# Patient Record
Sex: Female | Born: 1955
Health system: Southern US, Community
[De-identification: ages and names within clinical notes are randomized; demographics above are authoritative.]

## PROBLEM LIST (undated history)

## (undated) DIAGNOSIS — F329 Major depressive disorder, single episode, unspecified: Secondary | ICD-10-CM

## (undated) DIAGNOSIS — F32A Depression, unspecified: Secondary | ICD-10-CM

## (undated) DIAGNOSIS — I10 Essential (primary) hypertension: Secondary | ICD-10-CM

## (undated) DIAGNOSIS — F419 Anxiety disorder, unspecified: Secondary | ICD-10-CM

## (undated) HISTORY — DX: Depression, unspecified: F32.A

## (undated) HISTORY — DX: Essential (primary) hypertension: I10

## (undated) HISTORY — DX: Anxiety disorder, unspecified: F41.9

## (undated) HISTORY — PX: ABDOMINAL HYSTERECTOMY: SHX81

---
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
## 1998-11-04 ENCOUNTER — Ambulatory Visit (HOSPITAL_COMMUNITY): Admission: RE | Admit: 1998-11-04 | Discharge: 1998-11-04 | Payer: Self-pay | Admitting: Family Medicine

## 2002-02-24 ENCOUNTER — Ambulatory Visit (HOSPITAL_COMMUNITY): Admission: RE | Admit: 2002-02-24 | Discharge: 2002-02-24 | Payer: Self-pay | Admitting: Internal Medicine

## 2005-04-10 ENCOUNTER — Encounter (INDEPENDENT_AMBULATORY_CARE_PROVIDER_SITE_OTHER): Payer: Self-pay | Admitting: *Deleted

## 2005-04-10 ENCOUNTER — Ambulatory Visit (HOSPITAL_BASED_OUTPATIENT_CLINIC_OR_DEPARTMENT_OTHER): Admission: RE | Admit: 2005-04-10 | Discharge: 2005-04-10 | Payer: Self-pay | Admitting: Urology

## 2005-04-24 ENCOUNTER — Ambulatory Visit (HOSPITAL_BASED_OUTPATIENT_CLINIC_OR_DEPARTMENT_OTHER): Admission: RE | Admit: 2005-04-24 | Discharge: 2005-04-24 | Payer: Self-pay | Admitting: Urology

## 2005-04-24 ENCOUNTER — Encounter (INDEPENDENT_AMBULATORY_CARE_PROVIDER_SITE_OTHER): Payer: Self-pay | Admitting: *Deleted

## 2006-01-15 ENCOUNTER — Encounter: Admission: RE | Admit: 2006-01-15 | Discharge: 2006-01-15 | Payer: Self-pay | Admitting: Unknown Physician Specialty

## 2006-03-04 ENCOUNTER — Ambulatory Visit: Payer: Self-pay | Admitting: Family Medicine

## 2006-03-11 ENCOUNTER — Other Ambulatory Visit: Admission: RE | Admit: 2006-03-11 | Discharge: 2006-03-11 | Payer: Self-pay | Admitting: Family Medicine

## 2006-03-13 ENCOUNTER — Encounter: Admission: RE | Admit: 2006-03-13 | Discharge: 2006-03-13 | Payer: Self-pay | Admitting: Family Medicine

## 2006-04-24 ENCOUNTER — Ambulatory Visit (HOSPITAL_BASED_OUTPATIENT_CLINIC_OR_DEPARTMENT_OTHER): Admission: RE | Admit: 2006-04-24 | Discharge: 2006-04-24 | Payer: Self-pay | Admitting: Professional

## 2006-06-27 ENCOUNTER — Encounter: Admission: RE | Admit: 2006-06-27 | Discharge: 2006-06-27 | Payer: Self-pay | Admitting: Family Medicine

## 2006-07-04 ENCOUNTER — Encounter: Admission: RE | Admit: 2006-07-04 | Discharge: 2006-07-04 | Payer: Self-pay | Admitting: Family Medicine

## 2006-07-04 ENCOUNTER — Encounter (INDEPENDENT_AMBULATORY_CARE_PROVIDER_SITE_OTHER): Payer: Self-pay | Admitting: Diagnostic Radiology

## 2007-01-20 ENCOUNTER — Encounter: Admission: RE | Admit: 2007-01-20 | Discharge: 2007-01-20 | Payer: Self-pay | Admitting: Family Medicine

## 2007-07-22 ENCOUNTER — Ambulatory Visit (HOSPITAL_COMMUNITY): Admission: RE | Admit: 2007-07-22 | Discharge: 2007-07-22 | Payer: Self-pay | Admitting: Family Medicine

## 2008-05-19 ENCOUNTER — Ambulatory Visit (HOSPITAL_BASED_OUTPATIENT_CLINIC_OR_DEPARTMENT_OTHER): Admission: RE | Admit: 2008-05-19 | Discharge: 2008-05-19 | Payer: Self-pay | Admitting: Orthopedic Surgery

## 2008-08-09 ENCOUNTER — Encounter: Admission: RE | Admit: 2008-08-09 | Discharge: 2008-08-09 | Payer: Self-pay | Admitting: Family Medicine

## 2010-05-16 NOTE — Op Note (Signed)
NAME:  Melanie Riley, Melanie Riley NO.:  [id number]   MEDICAL RECORD NO.:  [id number]          PATIENT TYPE:  AMB   LOCATION:  DSC                          FACILITY:  [facility]   PHYSICIAN:  Harvie Junior, M.D.   DATE OF BIRTH:  02/06/1955   DATE OF PROCEDURE:  05/19/2008  DATE OF DISCHARGE:                               OPERATIVE REPORT   PREOPERATIVE DIAGNOSES:  Carpal tunnel syndrome, left with de Quervain  first extensor compartment tendonitis.   POSTOPERATIVE DIAGNOSES:  Carpal tunnel syndrome, left with de Quervain  first extensor compartment tendonitis.   PRINCIPAL PROCEDURE:  Left carpal tunnel release and left de Quervain  release.   SURGEON:  Harvie Junior, MD   ASSISTANT:  Marshia Ly, PA   ANESTHESIA:  Forearm-based IV regional.   BRIEF HISTORY:  Melanie Riley is a 55 year old female with a long history  of having significant carpal tunnel syndrome on the left side.  She also  had EMGs which documented this.  She was having significant pain in the  first extensor compartment and improved with injection therapy.  We  talked about treatment options, and ultimately felt the most appropriate  course of action for her was going to be release of first extensor  compartment and carpal tunnel release.  She was brought to the operating  room for this procedure.   PROCEDURE:  The patient was brought to the operating room.  After  adequate anesthesia obtained with general anesthetic, the patient was  placed supine on the operating table.  The left arm was prepped and  draped in the usual sterile fashion after forearm-based IV regional  anesthetic was performed.  Once this was accomplished, a small incision  was made over the distal ulnar to midline wrist crease, subcutaneous  tissues to the level of the carpal tunnel.  The volar carpal ligament  was divided in line with its fibers, and once this was completed, a  gloved finger could be placed in the wound proximally  and distally.  The  median nerve was identified as well as the motor branch and the wound  was then irrigated and closed in interval with interrupted nylon sutures  and attention was turned to just over the radial styloid where a curved  incision was made, subcutaneous tissues down to the level of the first  extensor compartment.  Care being taken to identify and protect the  digital nerves in that area.  Once the skin was opened, the first  extensor compartment was identified and we made this incision.  There  was a very tight stenosis of the first extensor compartment, happily  there were no accessory compartments.  We looked for those and did not  find and  once this was completed, the wound was copiously and thoroughly  irrigated and closed with interrupted nylon stitches.  A sterile  compressive dressing was applied as well as a thumb spica splint and the  patient taken to recovery and was noted to be in satisfactory condition.  Estimated blood loss for the procedure was none.      John L.  Luiz Blare, M.D.  Electronically Signed     JLG/MEDQ  D:  05/19/2008  T:  05/20/2008  Job:  [id number]

## 2010-05-19 NOTE — Op Note (Signed)
Melanie Riley, Melanie Riley NO.:  [id number]   MEDICAL RECORD NO.:  [id number]          PATIENT TYPE:  AMB   LOCATION:  NESC                         FACILITY:  [facility]   PHYSICIAN:  Martina Sinner, MD DATE OF BIRTH:  1955/07/21   DATE OF PROCEDURE:  04/10/2005  DATE OF DISCHARGE:                                 OPERATIVE REPORT   PREOP DIAGNOSIS:  Urethral stenosis; urethral lesion; pelvic pain.   POSTOP DIAGNOSIS:  Urethral stenosis; urethral lesion; pelvic pain.   SURGERY:  Cystoscopy, dilation of urethral stenosis, bladder  hydrodistention, bladder installation therapy and urethral biopsy.   Ms. Sendejo history and physical findings have been previously documented.  She was prepped and draped in usual fashion.  She was given preoperative  intravenous antibiotics.   I used a weighted vaginal speculum and could easily visualize the urethral  meatal circular lesion.  She did tell me prior to going to sleep that the  estrogen cream is helping some and that is encouraging.  I must say the  lesion was quite firm circumferentially.   I first tried to calibrate the urethra with an 18-French catheter.  A 12-  [place] and 18-French catheter would not go through the lesion.  I then  easily inserted a 6-French open-ended ureteral catheter.  I then placed a  sensor wire through this and over this passed the balloon dilation catheter.  I balloon dilated the urethra to 24-French for approximately four minutes.  There was minimal bleeding associated with this upon its removal.   Before cystoscoping the patient, 18-French catheter easily passed into the  urethra.  There was a little bit catching near the meatus where the lesion  was.  I then cystoscoped the patient with a 17-French Olympus scope.   Bladder mucosa trigone were normal.  There was grade 1/4 bladder  trabeculation.  There is no erythema.  I hydrodistended to 550 mL.  I  emptied the bladder and reinspected  bladder.  There is no findings in  keeping with interstitial cystitis.  I carefully cystoscoped urethra.  Urethra is only approximate 2.5 cm in length.  Palpably and visually it is a  bit short,but in my opinion the stenosis was at the meatus where the lesion  was almost as if the lesion was compressing or strangulating the meatus and  the rest of the urethra appeared visually normal and supple.   I decided to gently dilate with the female metal sounds to 26-French.  I  then recystoscoped.  There were the same urethral findings.   At 6 o'clock I then took an 8 mm x 6 mm thick biopsy of the lesion.  There  was some bleeding easily controlled with fulguration.  I used three  interrupted 3-0 chromics on an SH needle to reappose the epithelium.   A Foley catheter had been placed at the time of the biopsy to make certain  that I did not narrow the meatus had good control of the biopsy site.   I decided to leave a Foley catheter at the end of the case.  I instilled 15  mL of 0.5% Marcaine in combination with 400 mg of Pyridium and left a  catheter plug on the catheter.  I will remove the catheter tomorrow and send  her home with straight drainage with a leg bag.  She will be covered  perioperatively with antibiotics.   I want to rule out a malignancy.  This will be very surprising diagnosis if  it did represent a malignancy.  If it is benign, I am going to made her  initially with Estrace cream.  Over the next several weeks,  I will then do  a circumferential excisional biopsy of the lesion and hopefully it will be  much smaller at that time and more supple.  In my opinion, based upon its  position, this can be removed without threatening her continence status.  I  say this recognizing that her urethra is a little bit short, but in my  opinion this was a lesion of the meatus and not of the proximal and mid  urethra.           ______________________________  Martina Sinner, MD   Electronically Signed     SAM/MEDQ  D:  04/10/2005  T:  04/10/2005  Job:  [id number]

## 2010-05-19 NOTE — Op Note (Signed)
Melanie Riley, Melanie Riley NO.:  [id number]   MEDICAL RECORD NO.:  [id number]          PATIENT TYPE:  AMB   LOCATION:  NESC                         [facility]ACILITY:  [facility]   PHYSICIAN:  Martina Sinner, MD DATE OF BIRTH:  [birth year]   DATE OF PROCEDURE:  04/24/2005  DATE OF DISCHARGE:                                 OPERATIVE REPORT   PREOPERATIVE DIAGNOSIS:  Urethral caruncle.   POSTOPERATIVE DIAGNOSIS:  Urethral caruncle.   SURGERY:  1.  Excision of urethral caruncle.  2.  Cystoscopy.   [name] has a caruncle that was quite fibrotic and stenosing in its  nature.  Her symptoms started to recur since her biopsy and dilation.  We  elected to excise it today.   The patient was prepped and draped in the usual fashion.  A 14-French  catheter could be easily placed into the urethra.  She was covered  perioperatively with antibiotics.   With or without the catheter I could not easily see the urethral lumen  mucosa to place stay sutures.  I therefore made a full-thickness incision of  the caruncle 12 and 6 o'clock.  This allowed me to retract the caruncle and  put four 3-0 silk pop-off stay sutures, one in each quadrant, allowing me to  hold the urethral mucosa so it did not retract.  I then circumferentially  made an incision with a #15 scalpel blade around the caruncle.  It was then  very easy to excise using the stay sutures as proximal urethral mucosal  markers.  The caruncle was removed and two segments and sent to pathology.  Surprisingly, bleeding was minimal.  Irrigation was utilized.  I placed  approximately 10 3-0 chromic sutures circumferentially around urethra,  reapproximating periurethral skin to the urethral mucosa.  All four stay  sutures were removed.  The Foley catheter was removed.  I tried to scope the  patient with a 21-French scope, but it was a little bit tight.  I then used  a 17-French scope, which went in quite easily.  The urethra looked  actually  quite good.  It was long.  The excision should not affect her continent  mechanism.  I am going to leave the catheter in for three days.  Triple  antibiotic ointment was added and will be utilized as a dressing.  Will keep  have to keep an eye on her to make sure she does not develop urethral  stenosis.  Certainly, visually and palpably the meatus was soft and supple  but, again, she is at risk of stenosis over time.           ______________________________  Martina Sinner, MD  Electronically Signed     SAM/MEDQ  D:  04/24/2005  T:  04/25/2005  Job:  [id number]

## 2010-05-19 NOTE — Op Note (Signed)
Melanie Riley, Melanie Riley NO.:  [id number]   MEDICAL RECORD NO.:  [id number]          PATIENT TYPE:  AMB   LOCATION:  DSC                          FACILITY:  [facility]   PHYSICIAN:  Harvie Junior, M.D.   DATE OF BIRTH:  07-04-1955   DATE OF PROCEDURE:  04/24/2006  DATE OF DISCHARGE:                               OPERATIVE REPORT   PREOPERATIVE DIAGNOSIS:  Distal radius fracture with interarticular  comminution greater than 3 fragments.   POSTOPERATIVE DIAGNOSIS:  Distal radius fracture with interarticular  comminution greater than 3 fragments.   PRINCIPAL PROCEDURE:  Open reduction and internal fixation of distal  radius fracture with greater than 3 articular fragments.   SURGEON:  Harvie Junior, M.D.   ASSISTANT:  Marshia Ly, P.A.   ANESTHESIA:  General.   BRIEF HISTORY:  [name] is an 55 year old female with a long history  of having fallen onto her outstretched radius.  She was seen in the  office by Dr. Althea Charon who evaluated her with a distal radius fracture  and ultimately felt that the most appropriate course of action here was  going to be open reduction and internal fixation.  We talked to her  about this.  She felt like she needed to get back to work early and she  needed to have full function of the wrist.  At that point, we felt that  open reduction and internal fixation probably met her needs the best in  terms of getting her back to the earliest possible function, so she is  brought to the operating room for this procedure.   PROCEDURE IN DETAIL:  The patient is brought to the operating room, had  adequate anesthesia obtained with general anesthetic.  The patient was  placed up on the operating room table.  The left arm was prepped and  draped in the usual sterile fashion.  Following this, a routine prep and  drape was obtained.  The arm was exsanguinated.  Blood pressure  tourniquet was inflated to 250 mmHg.  Following this, an incision  was  made over the flexor carpi radialis tendons.  Subcutaneous tissue taken  down to the level of the tendon.  The tendon retracted radially.  The  floor of the flexor carpi radialis tendon was exploited and the pronator  quadratus was identified and resected off of the bone distally.  The  distal radius fracture was identified and held in an anatomically  reduced position and a 4-hole DVR plate was placed onto the bone.  In  anatomic position, the K wire was used to localize where I wanted the  DVR plate.  Once the K wire had been placed, it gave [place] some sense of  where we needed to be and then the pilot hole was drilled through the  central hole.  At this point, the plate was tinkered with just a little  bit to get it in perfect location and once it was perfectly localized  under fluoroscopic imaging with the arm in a perfectly reduced position,  then the plate was attached to the distal radius in  standard technique.  Seven smooth pegs were used in the distal portion and four total screws  in the proximal portion with standard technique.  Fluoro was used  throughout the case.  Anatomic reduction of the fracture and excellent  fixation with no concern for the pegs being too distal was achieved.   At this point, the wound was copiously irrigated and suctioned dry.  The  pronator quadratus was repaired with 3-0 Vicryl interrupted sutures and  then the skin was repaired with 3-0 Vicryl and 4-0 Maxon pull-off  suture.  Benzoin  and Steri-Strips were applied.  A sterile compression dressing was  applied as well as a volar splint.  The patient was taken to the  recovery room where she as noted to be in satisfactory condition.   ESTIMATED BLOOD LOSS:  None.      Harvie Junior, M.D.  Electronically Signed     JLG/MEDQ  D:  04/24/2006  T:  04/24/2006  Job:  [id number]

## 2010-12-12 ENCOUNTER — Other Ambulatory Visit: Payer: Self-pay | Admitting: Gastroenterology

## 2011-09-14 ENCOUNTER — Other Ambulatory Visit: Payer: Self-pay | Admitting: Family Medicine

## 2011-09-14 DIAGNOSIS — Z1231 Encounter for screening mammogram for malignant neoplasm of breast: Secondary | ICD-10-CM

## 2011-10-01 ENCOUNTER — Ambulatory Visit: Payer: Self-pay

## 2012-10-13 ENCOUNTER — Ambulatory Visit
Admission: RE | Admit: 2012-10-13 | Discharge: 2012-10-13 | Disposition: A | Discharge status: Home / Self Care | Payer: 59 | Source: Ambulatory Visit | Attending: Family Medicine | Admitting: Family Medicine

## 2012-10-13 DIAGNOSIS — Z1231 Encounter for screening mammogram for malignant neoplasm of breast: Secondary | ICD-10-CM

## 2014-05-17 ENCOUNTER — Other Ambulatory Visit: Payer: Self-pay

## 2014-05-17 DIAGNOSIS — Z1231 Encounter for screening mammogram for malignant neoplasm of breast: Secondary | ICD-10-CM

## 2014-06-01 ENCOUNTER — Ambulatory Visit
Admission: RE | Admit: 2014-06-01 | Discharge: 2014-06-01 | Disposition: A | Discharge status: Home / Self Care | Payer: 59 | Source: Ambulatory Visit

## 2014-06-01 DIAGNOSIS — Z1231 Encounter for screening mammogram for malignant neoplasm of breast: Secondary | ICD-10-CM

## 2015-07-12 ENCOUNTER — Other Ambulatory Visit: Payer: Self-pay | Admitting: Otolaryngology

## 2015-07-12 DIAGNOSIS — R0981 Nasal congestion: Secondary | ICD-10-CM

## 2015-10-06 ENCOUNTER — Other Ambulatory Visit: Payer: Self-pay | Admitting: Family Medicine

## 2015-10-06 DIAGNOSIS — Z1231 Encounter for screening mammogram for malignant neoplasm of breast: Secondary | ICD-10-CM

## 2015-10-18 ENCOUNTER — Ambulatory Visit: Payer: 59

## 2015-10-27 ENCOUNTER — Ambulatory Visit
Admission: RE | Admit: 2015-10-27 | Discharge: 2015-10-27 | Disposition: A | Discharge status: Home / Self Care | Payer: 59 | Source: Ambulatory Visit | Attending: Family Medicine | Admitting: Family Medicine

## 2015-10-27 DIAGNOSIS — Z1231 Encounter for screening mammogram for malignant neoplasm of breast: Secondary | ICD-10-CM

## 2016-03-16 ENCOUNTER — Other Ambulatory Visit: Payer: Self-pay | Admitting: Physician Assistant

## 2016-03-16 DIAGNOSIS — R0602 Shortness of breath: Secondary | ICD-10-CM

## 2016-03-16 DIAGNOSIS — E7800 Pure hypercholesterolemia, unspecified: Secondary | ICD-10-CM

## 2016-03-23 ENCOUNTER — Telehealth (HOSPITAL_COMMUNITY): Payer: Self-pay

## 2016-03-23 NOTE — Telephone Encounter (Signed)
Encounter complete. 

## 2016-03-27 ENCOUNTER — Telehealth (HOSPITAL_COMMUNITY): Payer: Self-pay

## 2016-03-27 NOTE — Telephone Encounter (Signed)
Encounter complete. 

## 2016-03-28 ENCOUNTER — Ambulatory Visit (HOSPITAL_COMMUNITY)
Admission: RE | Admit: 2016-03-28 | Discharge: 2016-03-28 | Disposition: A | Discharge status: Home / Self Care | Payer: 59 | Source: Ambulatory Visit | Attending: Cardiology | Admitting: Cardiology

## 2016-03-28 DIAGNOSIS — E7800 Pure hypercholesterolemia, unspecified: Secondary | ICD-10-CM | POA: Insufficient documentation

## 2016-03-28 DIAGNOSIS — R0602 Shortness of breath: Secondary | ICD-10-CM | POA: Insufficient documentation

## 2016-03-28 LAB — EXERCISE TOLERANCE TEST
CHL CUP RESTING HR STRESS: 78 {beats}/min
CSEPED: 4 min
CSEPEDS: 35 s
CSEPEW: 6.5 METS
CSEPHR: 91 %
CSEPPHR: 144 {beats}/min
MPHR: 158 {beats}/min
RPE: 18

## 2016-09-26 ENCOUNTER — Other Ambulatory Visit: Payer: Self-pay | Admitting: Physician Assistant

## 2016-09-26 DIAGNOSIS — Z1231 Encounter for screening mammogram for malignant neoplasm of breast: Secondary | ICD-10-CM

## 2016-10-29 ENCOUNTER — Ambulatory Visit: Payer: 59

## 2016-11-19 ENCOUNTER — Ambulatory Visit
Admission: RE | Admit: 2016-11-19 | Discharge: 2016-11-19 | Disposition: A | Discharge status: Home / Self Care | Payer: 59 | Source: Ambulatory Visit | Attending: Physician Assistant | Admitting: Physician Assistant

## 2016-11-19 DIAGNOSIS — Z1231 Encounter for screening mammogram for malignant neoplasm of breast: Secondary | ICD-10-CM

## 2017-12-30 ENCOUNTER — Ambulatory Visit
Admission: RE | Admit: 2017-12-30 | Discharge: 2017-12-30 | Disposition: A | Discharge status: Home / Self Care | Payer: BLUE CROSS/BLUE SHIELD | Source: Ambulatory Visit | Attending: Physician Assistant | Admitting: Physician Assistant

## 2017-12-30 ENCOUNTER — Other Ambulatory Visit: Payer: Self-pay | Admitting: Physician Assistant

## 2017-12-30 DIAGNOSIS — Z1231 Encounter for screening mammogram for malignant neoplasm of breast: Secondary | ICD-10-CM

## 2018-03-25 IMAGING — MG 2D DIGITAL SCREENING BILATERAL MAMMOGRAM WITH CAD AND ADJUNCT TO
8 of 14 series · 8 of 30 positions shown · non-contrast
Comparison: Previous exam(s).

CLINICAL DATA: Screening.

EXAM:
2D DIGITAL SCREENING BILATERAL MAMMOGRAM WITH CAD AND ADJUNCT TOMO

[L MLO (1 of 3)]
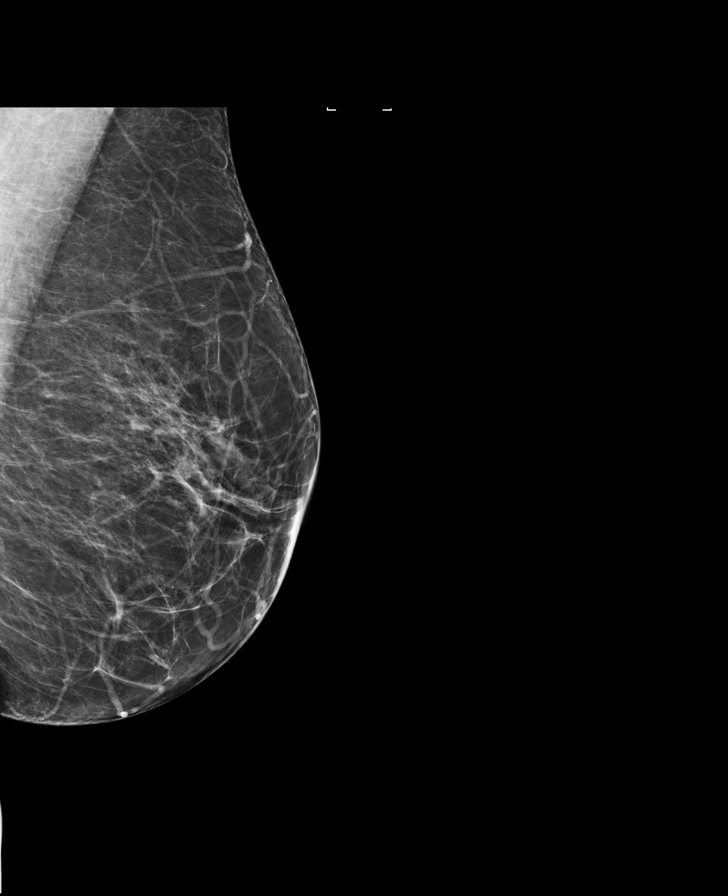

[L MLO (2 of 3)]
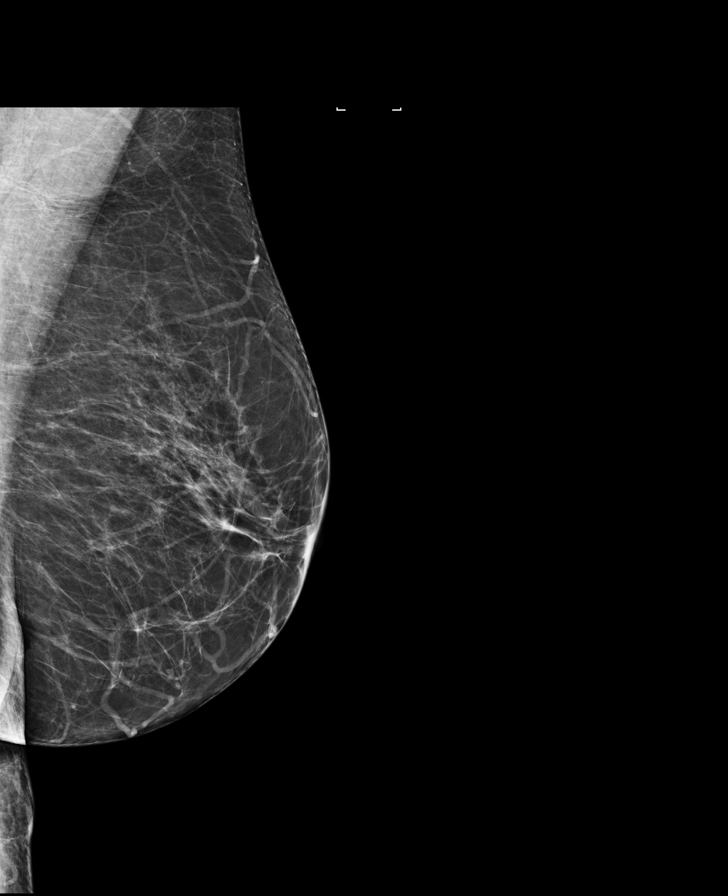

[L MLO (3 of 3)]
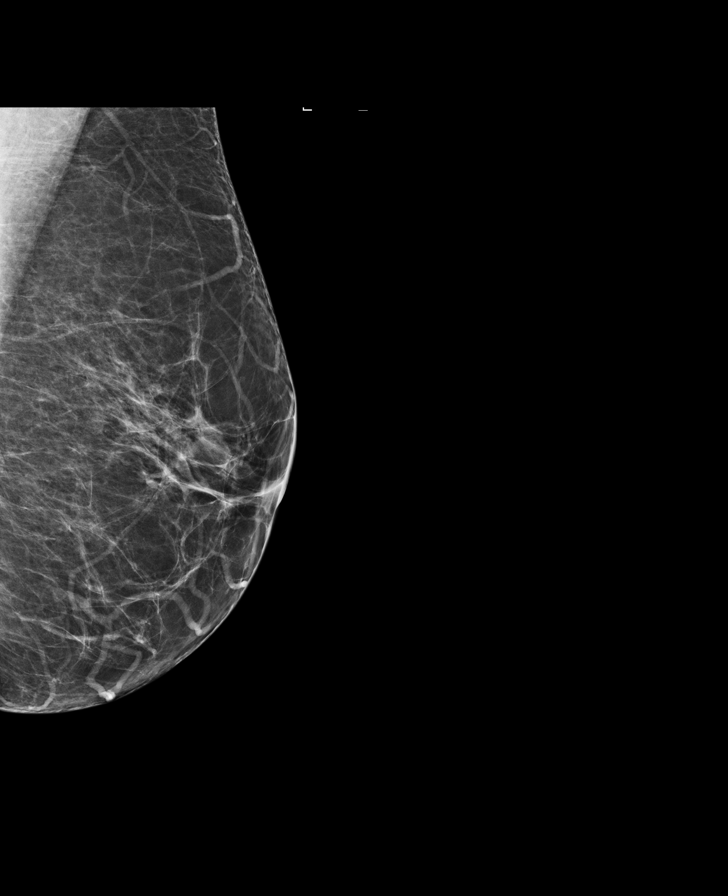

[R CC]
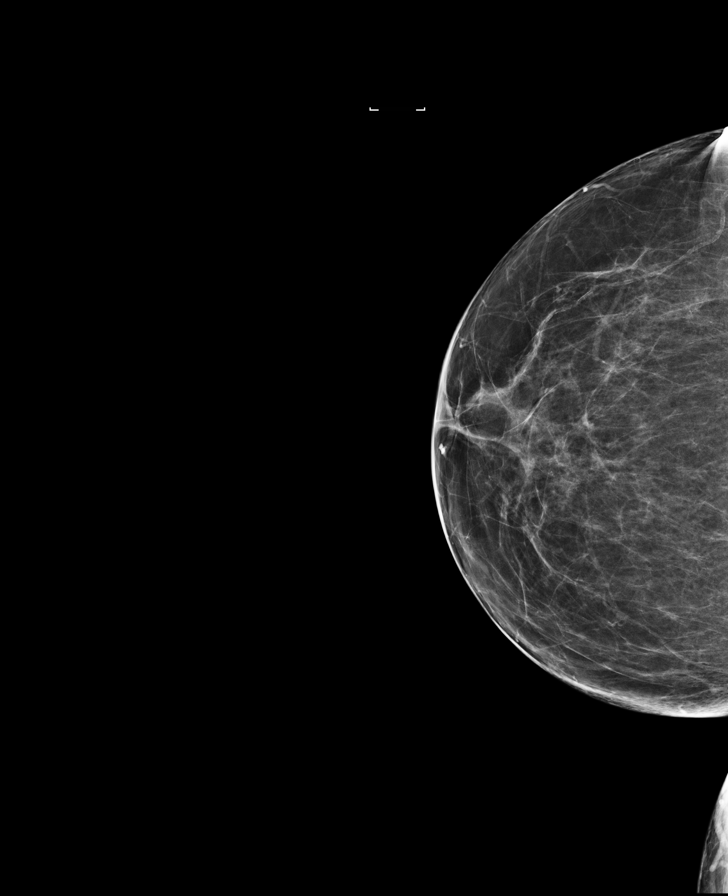

[R CC synth-2D]
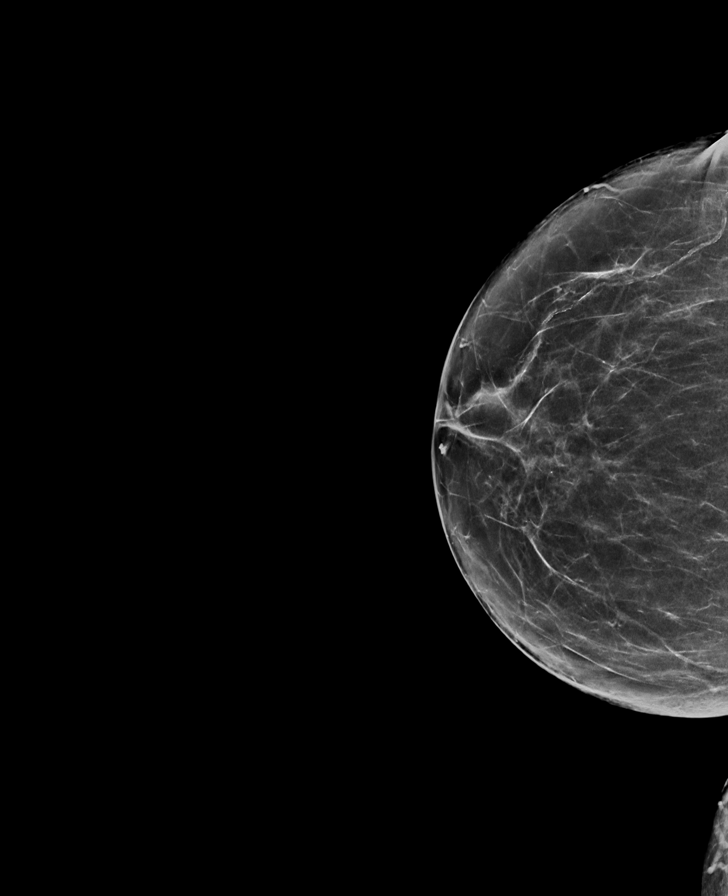

[L MLO synth-2D]
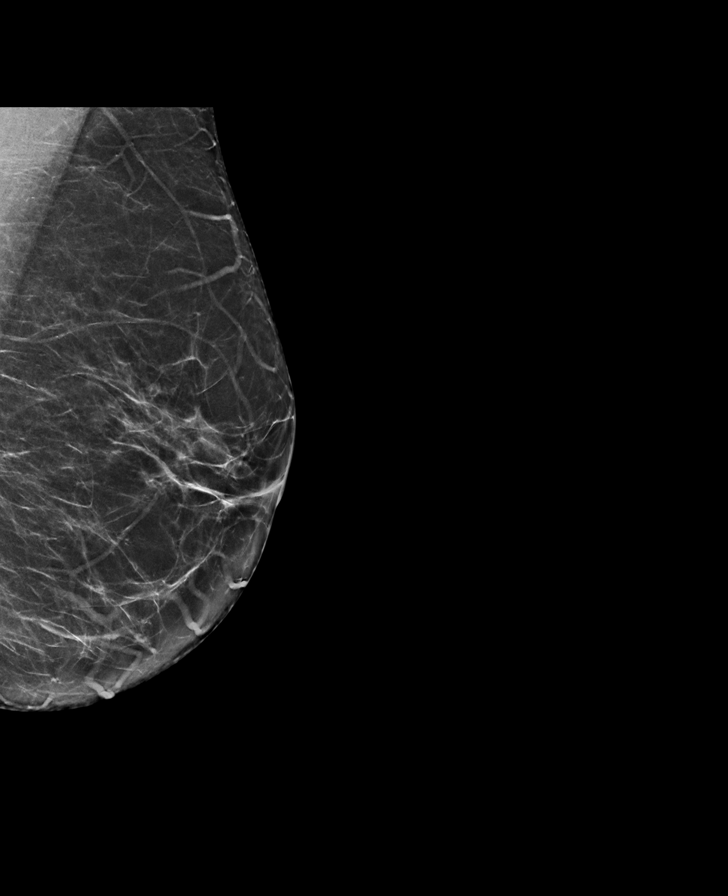

[R MLO synth-2D]
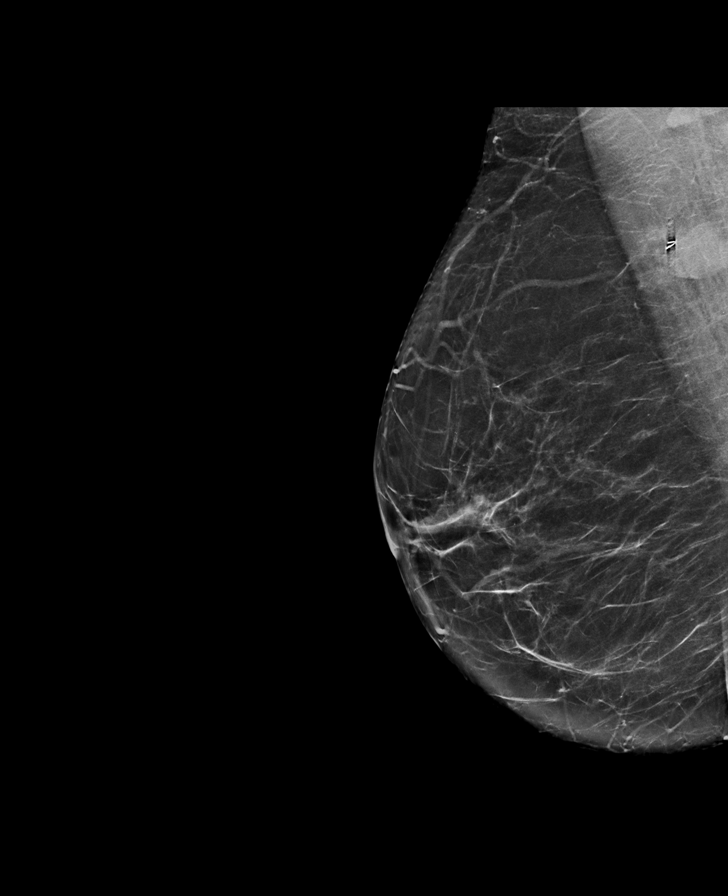

[L CC]
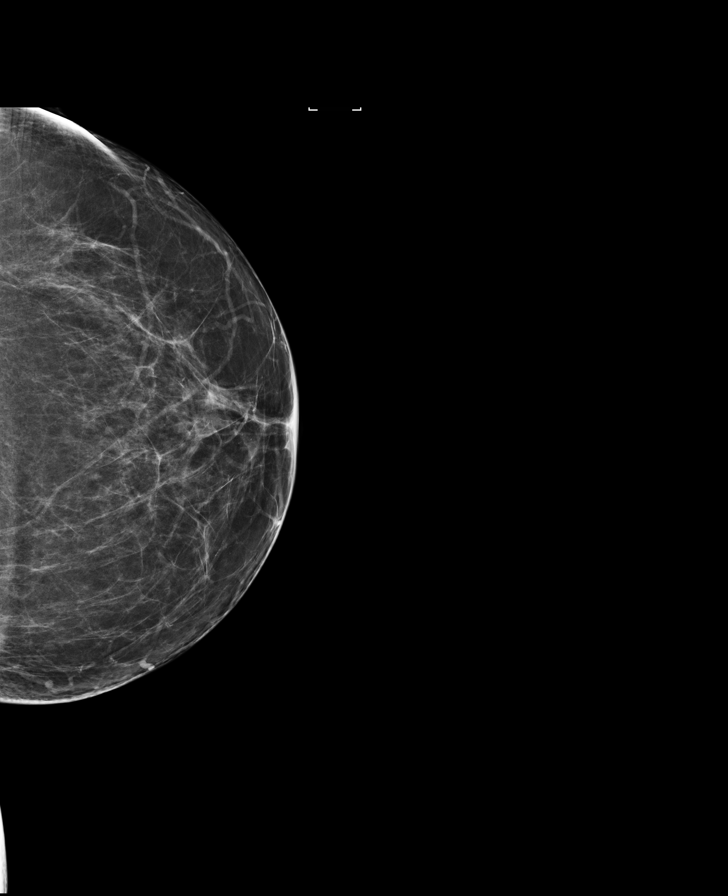

[8 of 30 positions shown; findings below may reference images not displayed]

ACR Breast Density Category b: There are scattered areas of
fibroglandular density.
FINDINGS: There are no findings suspicious for malignancy. Images were
processed with CAD.
IMPRESSION: No mammographic evidence of malignancy. A result letter of this
screening mammogram will be mailed directly to the patient.

RECOMMENDATION:
Screening mammogram in one year. (Code:97-6-RS4)

BI-RADS CATEGORY  1: Negative.

## 2018-11-20 ENCOUNTER — Inpatient Hospital Stay (HOSPITAL_COMMUNITY)
Admission: RE | Admit: 2018-11-20 | Discharge: 2018-11-26 | DRG: 885 | Disposition: A | Discharge status: Home / Self Care | Payer: Medicare HMO | Source: Intra-hospital | Attending: Psychiatry | Admitting: Psychiatry

## 2018-11-20 ENCOUNTER — Encounter (HOSPITAL_COMMUNITY): Payer: Self-pay | Admitting: Psychiatry

## 2018-11-20 ENCOUNTER — Other Ambulatory Visit: Payer: Self-pay

## 2018-11-20 ENCOUNTER — Ambulatory Visit (HOSPITAL_COMMUNITY)
Admission: RE | Admit: 2018-11-20 | Discharge: 2018-11-20 | Disposition: A | Discharge status: Home / Self Care | Payer: Medicare HMO | Attending: Psychiatry | Admitting: Psychiatry

## 2018-11-20 DIAGNOSIS — G4700 Insomnia, unspecified: Secondary | ICD-10-CM | POA: Diagnosis present

## 2018-11-20 DIAGNOSIS — I10 Essential (primary) hypertension: Secondary | ICD-10-CM | POA: Diagnosis present

## 2018-11-20 DIAGNOSIS — Z20828 Contact with and (suspected) exposure to other viral communicable diseases: Secondary | ICD-10-CM | POA: Diagnosis not present

## 2018-11-20 DIAGNOSIS — F333 Major depressive disorder, recurrent, severe with psychotic symptoms: Principal | ICD-10-CM | POA: Diagnosis present

## 2018-11-20 DIAGNOSIS — E119 Type 2 diabetes mellitus without complications: Secondary | ICD-10-CM | POA: Diagnosis present

## 2018-11-20 DIAGNOSIS — T1491XA Suicide attempt, initial encounter: Secondary | ICD-10-CM | POA: Insufficient documentation

## 2018-11-20 DIAGNOSIS — F419 Anxiety disorder, unspecified: Secondary | ICD-10-CM | POA: Diagnosis present

## 2018-11-20 DIAGNOSIS — R45851 Suicidal ideations: Secondary | ICD-10-CM | POA: Diagnosis present

## 2018-11-20 DIAGNOSIS — R45850 Homicidal ideations: Secondary | ICD-10-CM | POA: Diagnosis present

## 2018-11-20 DIAGNOSIS — F1210 Cannabis abuse, uncomplicated: Secondary | ICD-10-CM | POA: Diagnosis not present

## 2018-11-20 DIAGNOSIS — F322 Major depressive disorder, single episode, severe without psychotic features: Secondary | ICD-10-CM | POA: Insufficient documentation

## 2018-11-20 DIAGNOSIS — R69 Illness, unspecified: Secondary | ICD-10-CM | POA: Diagnosis not present

## 2018-11-20 DIAGNOSIS — F332 Major depressive disorder, recurrent severe without psychotic features: Secondary | ICD-10-CM | POA: Diagnosis not present

## 2018-11-20 DIAGNOSIS — F411 Generalized anxiety disorder: Secondary | ICD-10-CM | POA: Diagnosis not present

## 2018-11-20 LAB — SARS CORONAVIRUS 2 BY RT PCR (HOSPITAL ORDER, PERFORMED IN ~~LOC~~ HOSPITAL LAB): SARS Coronavirus 2: NEGATIVE

## 2018-11-20 MED ORDER — ACETAMINOPHEN 325 MG PO TABS
650.0000 mg | ORAL_TABLET | Freq: Four times a day (QID) | ORAL | Status: DC | PRN
  Filled 2018-11-20: qty 2

## 2018-11-20 MED ORDER — TRAZODONE HCL 50 MG PO TABS
50.0000 mg | ORAL_TABLET | Freq: Every evening | ORAL | Status: DC | PRN
  Administered 2018-11-20 – 2018-11-22 (×3): 50 mg via ORAL
  Filled 2018-11-20 (×3): qty 1

## 2018-11-20 MED ORDER — TRAZODONE HCL 100 MG PO TABS
100.0000 mg | ORAL_TABLET | Freq: Every day | ORAL | Status: DC
  Filled 2018-11-20 (×2): qty 1

## 2018-11-20 MED ORDER — MAGNESIUM HYDROXIDE 400 MG/5ML PO SUSP: 30.0000 mL | Freq: Every day | ORAL | Status: DC | PRN

## 2018-11-20 MED ORDER — MAGNESIUM HYDROXIDE 400 MG/5ML PO SUSP
30.0000 mL | Freq: Every day | ORAL | Status: DC | PRN
  Filled 2018-11-20: qty 30

## 2018-11-20 MED ORDER — HYDROXYZINE HCL 25 MG PO TABS
25.0000 mg | ORAL_TABLET | Freq: Three times a day (TID) | ORAL | Status: DC | PRN
  Administered 2018-11-20 – 2018-11-25 (×12): 25 mg via ORAL
  Filled 2018-11-20 (×12): qty 1

## 2018-11-20 MED ORDER — HYDROXYZINE HCL 25 MG PO TABS
25.0000 mg | ORAL_TABLET | Freq: Four times a day (QID) | ORAL | Status: DC | PRN
  Filled 2018-11-20: qty 1

## 2018-11-20 MED ORDER — ALUM & MAG HYDROXIDE-SIMETH 200-200-20 MG/5ML PO SUSP
30.0000 mL | ORAL | Status: DC | PRN
  Filled 2018-11-20: qty 30

## 2018-11-20 MED ORDER — ACETAMINOPHEN 325 MG PO TABS
650.0000 mg | ORAL_TABLET | Freq: Four times a day (QID) | ORAL | Status: DC | PRN
  Administered 2018-11-25: 650 mg via ORAL
  Filled 2018-11-20: qty 2

## 2018-11-20 MED ORDER — ALUM & MAG HYDROXIDE-SIMETH 200-200-20 MG/5ML PO SUSP: 30.0000 mL | ORAL | Status: DC | PRN
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
## 2018-11-20 NOTE — Plan of Care (Signed)
Newly admitted. Cooperative during admission. Motivated for treatment.
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
## 2018-11-21 DIAGNOSIS — F332 Major depressive disorder, recurrent severe without psychotic features: Secondary | ICD-10-CM

## 2018-11-21 DIAGNOSIS — F1210 Cannabis abuse, uncomplicated: Secondary | ICD-10-CM

## 2018-11-21 DIAGNOSIS — F411 Generalized anxiety disorder: Secondary | ICD-10-CM

## 2018-11-21 LAB — URINALYSIS, COMPLETE (UACMP) WITH MICROSCOPIC
Bilirubin Urine: NEGATIVE
Glucose, UA: NEGATIVE mg/dL
Hgb urine dipstick: NEGATIVE
Ketones, ur: NEGATIVE mg/dL
Leukocytes,Ua: NEGATIVE
Nitrite: NEGATIVE
Protein, ur: NEGATIVE mg/dL
Specific Gravity, Urine: 1.006 (ref 1.005–1.030)
pH: 7 (ref 5.0–8.0)

## 2018-11-21 LAB — COMPREHENSIVE METABOLIC PANEL
ALT: 20 U/L (ref 0–44)
AST: 27 U/L (ref 15–41)
Albumin: 3.8 g/dL (ref 3.5–5.0)
Alkaline Phosphatase: 96 U/L (ref 38–126)
Anion gap: 12 (ref 5–15)
BUN: 12 mg/dL (ref 8–23)
CO2: 27 mmol/L (ref 22–32)
Calcium: 9.4 mg/dL (ref 8.9–10.3)
Chloride: 102 mmol/L (ref 98–111)
Creatinine, Ser: 0.8 mg/dL (ref 0.44–1.00)
GFR calc Af Amer: >60 mL/min (ref >60)
GFR calc non Af Amer: >60 mL/min (ref >60)
Glucose, Bld: 119 mg/dL — ABNORMAL HIGH (ref 70–99)
Potassium: 2.8 mmol/L — ABNORMAL LOW (ref 3.5–5.1)
Sodium: 141 mmol/L (ref 135–145)
Total Bilirubin: 0.6 mg/dL (ref 0.3–1.2)
Total Protein: 6.9 g/dL (ref 6.5–8.1)

## 2018-11-21 LAB — GLUCOSE, CAPILLARY
Glucose-Capillary: 103 mg/dL — ABNORMAL HIGH (ref 70–99)
Glucose-Capillary: 172 mg/dL — ABNORMAL HIGH (ref 70–99)

## 2018-11-21 LAB — LIPID PANEL
Cholesterol: 193 mg/dL (ref 0–200)
HDL: 33 mg/dL — ABNORMAL LOW (ref >40)
LDL Cholesterol: 131 mg/dL — ABNORMAL HIGH (ref 0–99)
Total CHOL/HDL Ratio: 5.8 RATIO
Triglycerides: 144 mg/dL (ref <150)
VLDL: 29 mg/dL (ref 0–40)

## 2018-11-21 LAB — RAPID URINE DRUG SCREEN, HOSP PERFORMED
Amphetamines: NOT DETECTED
Barbiturates: NOT DETECTED
Benzodiazepines: NOT DETECTED
Cocaine: NOT DETECTED
Opiates: NOT DETECTED
Tetrahydrocannabinol: POSITIVE — AB

## 2018-11-21 LAB — HEPATIC FUNCTION PANEL
ALT: 20 U/L (ref 0–44)
AST: 27 U/L (ref 15–41)
Albumin: 3.8 g/dL (ref 3.5–5.0)
Alkaline Phosphatase: 99 U/L (ref 38–126)
Bilirubin, Direct: 0.1 mg/dL (ref 0.0–0.2)
Indirect Bilirubin: 0.4 mg/dL (ref 0.3–0.9)
Total Bilirubin: 0.5 mg/dL (ref 0.3–1.2)
Total Protein: 6.9 g/dL (ref 6.5–8.1)

## 2018-11-21 LAB — HEMOGLOBIN A1C
Hgb A1c MFr Bld: 6 % — ABNORMAL HIGH (ref 4.8–5.6)
Mean Plasma Glucose: 125.5 mg/dL

## 2018-11-21 LAB — MAGNESIUM: Magnesium: 2 mg/dL (ref 1.7–2.4)

## 2018-11-21 LAB — ETHANOL: Alcohol, Ethyl (B): <10 mg/dL (ref <10)

## 2018-11-21 LAB — TSH: TSH: 3.557 u[IU]/mL (ref 0.350–4.500)

## 2018-11-21 MED ORDER — BUSPIRONE HCL 5 MG PO TABS
5.0000 mg | ORAL_TABLET | Freq: Three times a day (TID) | ORAL | Status: DC
  Administered 2018-11-21 – 2018-11-26 (×16): 5 mg via ORAL
  Filled 2018-11-21 (×22): qty 1

## 2018-11-21 MED ORDER — CITALOPRAM HYDROBROMIDE 10 MG PO TABS
10.0000 mg | ORAL_TABLET | Freq: Every day | ORAL | Status: DC
  Administered 2018-11-21 – 2018-11-23 (×3): 10 mg via ORAL
  Filled 2018-11-21 (×5): qty 1

## 2018-11-21 NOTE — Plan of Care (Signed)
  Problem: Education: Goal: Knowledge of the prescribed therapeutic regimen will improve Outcome: Progressing   Problem: Coping: Goal: Coping ability will improve Outcome: Progressing   D: Pt alert and oriented on the unit. Pt was isolated in her room asleep for most of the day. Pt denies SI/HI, A/VH. Pt participated during unit groups and activities. Pt is pleasant and cooperative. A: Education, support and encouragement provided, q15 minute safety checks remain in effect. Medications administered per MD orders. R: No reactions/side effects to medicine noted. Pt denies any concerns at this time, and verbally contracts for safety. Pt ambulating on the unit with no issues. Pt remains safe on and off the unit.
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
## 2018-11-21 NOTE — Progress Notes (Signed)
Patient ID: KENNICE FINNIE, female   DOB: Sep 17, 1955, 63 y.o.   MRN: [id number]  [place] NOVEL CORONAVIRUS (COVID-19) DAILY CHECK-OFF SYMPTOMS - answer yes or no to each - every day NO YES  Have you had a fever in the past 24 hours?  . Fever (Temp > 37.80C / 100F) X   Have you had any of these symptoms in the past 24 hours? . New Cough .  Sore Throat  .  Shortness of Breath .  Difficulty Breathing .  Unexplained Body Aches   X   Have you had any one of these symptoms in the past 24 hours not related to allergies?   . Runny Nose .  Nasal Congestion .  Sneezing   X   If you have had runny nose, nasal congestion, sneezing in the past 24 hours, has it worsened?  X   EXPOSURES - check yes or no X   Have you traveled outside the state in the past 14 days?  X   Have you been in contact with someone with a confirmed diagnosis of COVID-19 or PUI in the past 14 days without wearing appropriate PPE?  X   Have you been living in the same home as a person with confirmed diagnosis of COVID-19 or a PUI (household contact)?    X   Have you been diagnosed with COVID-19?    X              What to do next: Answered NO to all: Answered YES to anything:   Proceed with unit schedule Follow the [facility] Inpatient Flowsheet.
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
## 2018-11-21 NOTE — Tx Team (Signed)
Interdisciplinary Treatment and Diagnostic Plan Update  11/21/2018 Time of Session: 9:00am Melanie Riley MRN: [id number]  Principal Diagnosis: <principal problem not specified>  Secondary Diagnoses: Active Problems:   * No active hospital problems. *   Current Medications:  Current Facility-Administered Medications  Medication Dose Route Frequency Provider Last Rate Last Dose  . acetaminophen (TYLENOL) tablet 650 mg  650 mg Oral Q6H PRN Anike, Adaku C, NP      . alum & mag hydroxide-simeth (MAALOX/MYLANTA) 200-200-20 MG/5ML suspension 30 mL  30 mL Oral Q4H PRN Anike, Adaku C, NP      . busPIRone (BUSPAR) tablet 5 mg  5 mg Oral TID Sharma Covert, MD      . citalopram (CELEXA) tablet 10 mg  10 mg Oral Daily Sharma Covert, MD      . hydrOXYzine (ATARAX/VISTARIL) tablet 25 mg  25 mg Oral TID PRN Anike, Adaku C, NP   25 mg at 11/20/18 2312  . magnesium hydroxide (MILK OF MAGNESIA) suspension 30 mL  30 mL Oral Daily PRN Anike, Adaku C, NP      . traZODone (DESYREL) tablet 50 mg  50 mg Oral QHS PRN Anike, Adaku C, NP   50 mg at 11/20/18 2312   PTA Medications: No medications prior to admission.    Patient Stressors: Legal issue Occupational concerns  Patient Strengths: Ability for insight Average or above average intelligence Communication skills Motivation for treatment/growth Physical Health  Treatment Modalities: Medication Management, Group therapy, Case management,  1 to 1 session with clinician, Psychoeducation, Recreational therapy.   Physician Treatment Plan for Primary Diagnosis: <principal problem not specified> Long Term Goal(s): Improvement in symptoms so as ready for discharge Improvement in symptoms so as ready for discharge   Short Term Goals: Ability to identify changes in lifestyle to reduce recurrence of condition will improve Ability to verbalize feelings will improve Ability to disclose and discuss suicidal ideas Ability to demonstrate  self-control will improve Ability to identify and develop effective coping behaviors will improve Ability to maintain clinical measurements within normal limits will improve Ability to identify changes in lifestyle to reduce recurrence of condition will improve Ability to verbalize feelings will improve Ability to disclose and discuss suicidal ideas Ability to demonstrate self-control will improve Ability to identify and develop effective coping behaviors will improve Ability to maintain clinical measurements within normal limits will improve  Medication Management: Evaluate patient's response, side effects, and tolerance of medication regimen.  Therapeutic Interventions: 1 to 1 sessions, Unit Group sessions and Medication administration.  Evaluation of Outcomes: Not Met  Physician Treatment Plan for Secondary Diagnosis: Active Problems:   * No active hospital problems. *  Long Term Goal(s): Improvement in symptoms so as ready for discharge Improvement in symptoms so as ready for discharge   Short Term Goals: Ability to identify changes in lifestyle to reduce recurrence of condition will improve Ability to verbalize feelings will improve Ability to disclose and discuss suicidal ideas Ability to demonstrate self-control will improve Ability to identify and develop effective coping behaviors will improve Ability to maintain clinical measurements within normal limits will improve Ability to identify changes in lifestyle to reduce recurrence of condition will improve Ability to verbalize feelings will improve Ability to disclose and discuss suicidal ideas Ability to demonstrate self-control will improve Ability to identify and develop effective coping behaviors will improve Ability to maintain clinical measurements within normal limits will improve     Medication Management: Evaluate patient's response, side effects, and  tolerance of medication regimen.  Therapeutic Interventions: 1  to 1 sessions, Unit Group sessions and Medication administration.  Evaluation of Outcomes: Not Met   RN Treatment Plan for Primary Diagnosis: <principal problem not specified> Long Term Goal(s): Knowledge of disease and therapeutic regimen to maintain health will improve  Short Term Goals: Ability to disclose and discuss suicidal ideas and Ability to identify and develop effective coping behaviors will improve  Medication Management: RN will administer medications as ordered by provider, will assess and evaluate patient's response and provide education to patient for prescribed medication. RN will report any adverse and/or side effects to prescribing provider.  Therapeutic Interventions: 1 on 1 counseling sessions, Psychoeducation, Medication administration, Evaluate responses to treatment, Monitor vital signs and CBGs as ordered, Perform/monitor CIWA, COWS, AIMS and Fall Risk screenings as ordered, Perform wound care treatments as ordered.  Evaluation of Outcomes: Not Met   LCSW Treatment Plan for Primary Diagnosis: <principal problem not specified> Long Term Goal(s): Safe transition to appropriate next level of care at discharge, Engage patient in therapeutic group addressing interpersonal concerns.  Short Term Goals: Engage patient in aftercare planning with referrals and resources, Increase social support, Increase emotional regulation, Identify triggers associated with mental health/substance abuse issues and Increase skills for wellness and recovery  Therapeutic Interventions: Assess for all discharge needs, 1 to 1 time with Social worker, Explore available resources and support systems, Assess for adequacy in community support network, Educate family and significant other(s) on suicide prevention, Complete Psychosocial Assessment, Interpersonal group therapy.  Evaluation of Outcomes: Not Met   Progress in Treatment: Attending groups: No. New to unit. Participating in groups:  No. Taking medication as prescribed: Yes. Toleration medication: Yes. Family/Significant other contact made: No, will contact:  supports if consents are granted. Patient understands diagnosis: Yes. Discussing patient identified problems/goals with staff: Yes. Medical problems stabilized or resolved: Yes. Denies suicidal/homicidal ideation: No. Issues/concerns per patient self-inventory: Yes.  New problem(s) identified: Yes, Describe:  legal issues, financial stressors.  New Short Term/Long Term Goal(s): medication management for mood stabilization; elimination of SI thoughts; development of comprehensive mental wellness/sobriety plan.  Patient Goals:  Discharge Plan or Barriers: Patient newly admitted to unit, CSW assessing for appropriate referrals.   Reason for Continuation of Hospitalization: Anxiety Depression Medication stabilization  Estimated Length of Stay: 3-5 days  Attendees: Patient:  11/21/2018 9:28 AM  Physician: Queen Blossom  11/21/2018 9:28 AM  Nursing: Elberta Fortis, RN 11/21/2018 9:28 AM  RN Care Manager: 11/21/2018 9:28 AM  Social Worker: Stephanie Acre, [place] 11/21/2018 9:28 AM  Recreational Therapist:  11/21/2018 9:28 AM  Other:  11/21/2018 9:28 AM  Other:  11/21/2018 9:28 AM  Other: 11/21/2018 9:28 AM    Scribe for Treatment Team: Joellen [place], [place] 11/21/2018 9:28 AM

## 2018-11-21 NOTE — Progress Notes (Signed)
Recreation Therapy Notes  Date:  11.20.20 Time: 0930 Location: [address] Dayroom  Group Topic: Stress Management  Goal Area(s) Addresses:  Patient will identify positive stress management techniques. Patient will identify benefits of using stress management post d/c.  Intervention: Stress Management  Activity: Guided Imagery.  LRT read a script that focused on relaxing by the peaceful waves at the beach.  Patients were to listen and following along as script was read to engage in the activity.   Education:  Stress Management, Discharge Planning.   Education Outcome: Acknowledges Education  Clinical Observations/Feedback:  Pt did not attend group session.     Victorino Sparrow, LRT/CTRS     Victorino Sparrow A 11/21/2018 11:12 AM

## 2018-11-21 NOTE — Tx Team (Signed)
Initial Treatment Plan 11/20/2018 11:59 PM Melanie Riley QMG:[id number]    PATIENT STRESSORS: Legal issue Occupational concerns   PATIENT STRENGTHS: Ability for insight Average or above average intelligence Communication skills Motivation for treatment/growth Physical Health   PATIENT IDENTIFIED PROBLEMS: Depression  Anxiety  Suicidal thoughts                 DISCHARGE CRITERIA:  Ability to meet basic life and health needs Improved stabilization in mood, thinking, and/or behavior Motivation to continue treatment in a less acute level of care  PRELIMINARY DISCHARGE PLAN: Outpatient therapy Participate in family therapy Return to previous living arrangement  PATIENT/FAMILY INVOLVEMENT: This treatment plan has been presented to and reviewed with the patient, Melanie Riley,.  The patient has been given the opportunity to ask questions and make suggestions.  Ronelle Nigh, RN 11/20/2018, 11:59 PM
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
## 2018-11-21 NOTE — [facility] Counselor (Signed)
CSW attempted to meet with patient to complete PSA. Patient appeared to be sleeping soundly and did not respond to CSW knocking on their door or calling name.  CSW will follow up at a later time.  Zyriah Mask, LCSW-A Clinical Social Worker 

## 2018-11-22 DIAGNOSIS — F333 Major depressive disorder, recurrent, severe with psychotic symptoms: Principal | ICD-10-CM

## 2018-11-22 MED ORDER — POLYETHYLENE GLYCOL 3350 17 G PO PACK
17.0000 g | PACK | Freq: Every day | ORAL | Status: DC | PRN
  Administered 2018-11-24: 17 g via ORAL
  Filled 2018-11-22: qty 1

## 2018-11-22 MED ORDER — POLYETHYLENE GLYCOL 3350 17 G PO PACK: 17.0000 g | PACK | Freq: Every day | ORAL | Status: DC

## 2018-11-22 MED ORDER — TRAZODONE HCL 50 MG PO TABS
50.0000 mg | ORAL_TABLET | Freq: Every evening | ORAL | Status: DC | PRN
  Administered 2018-11-22: 23:00:00 50 mg via ORAL

## 2018-11-22 MED ORDER — LISINOPRIL 10 MG PO TABS
10.0000 mg | ORAL_TABLET | Freq: Once | ORAL | Status: AC
  Administered 2018-11-22: 12:00:00 10 mg via ORAL
  Filled 2018-11-22: qty 1
  Filled 2018-11-22: qty 2

## 2018-11-22 MED ORDER — LISINOPRIL 5 MG PO TABS
5.0000 mg | ORAL_TABLET | Freq: Every day | ORAL | Status: DC
  Administered 2018-11-23 – 2018-11-26 (×4): 5 mg via ORAL
  Filled 2018-11-22 (×6): qty 1
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
## 2018-11-22 NOTE — Progress Notes (Signed)
[facility] Group Notes:  (Nursing/MHT/Case Management/Adjunct)  Date:  11/22/2018  Time:  2030  Type of Therapy:  wrap up group  Participation Level:  Active  Participation Quality:  Appropriate, Attentive, Sharing and Supportive  Affect:  Anxious  Cognitive:  Alert  Insight:  Improving  Engagement in Group:  Engaged  Modes of Intervention:  Clarification, Education and Support  Summary of Progress/Problems:  Melanie Riley 11/22/2018, 9:34 PM

## 2018-11-22 NOTE — BHH Group Notes (Signed)
[facility] Group Notes: (Clinical Social Work)   11/22/2018      Type of Therapy:  Group Therapy   Participation Level:  Did Not Attend - was invited both individually by MHT and by overhead announcement, chose not to attend.   Selmer Dominion, LCSW 11/22/2018, 11:10 AM
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
## 2018-11-22 NOTE — Progress Notes (Signed)
[facility] NOVEL CORONAVIRUS (COVID-19) DAILY CHECK-OFF SYMPTOMS - answer yes or no to each - every day NO YES  Have you had a fever in the past 24 hours?  . Fever (Temp > 37.80C / 100F) X   Have you had any of these symptoms in the past 24 hours? . New Cough .  Sore Throat  .  Shortness of Breath .  Difficulty Breathing .  Unexplained Body Aches   X   Have you had any one of these symptoms in the past 24 hours not related to allergies?   . Runny Nose .  Nasal Congestion .  Sneezing   X   If you have had runny nose, nasal congestion, sneezing in the past 24 hours, has it worsened?  X   EXPOSURES - check yes or no X   Have you traveled outside the state in the past 14 days?  X   Have you been in contact with someone with a confirmed diagnosis of COVID-19 or PUI in the past 14 days without wearing appropriate PPE?  X   Have you been living in the same home as a person with confirmed diagnosis of COVID-19 or a PUI (household contact)?    X   Have you been diagnosed with COVID-19?    X              What to do next: Answered NO to all: Answered YES to anything:   Proceed with unit schedule Follow the [facility] Inpatient Flowsheet.   

## 2018-11-22 NOTE — Progress Notes (Signed)
D. Pt presents with a sad affect/ depressed mood- calm, cooperative behavior- per pt's self inventory, pt rated her depression, hopelessness and anxiety all 8's. Pt currently denies SI/HI and AVH  A. Labs and vitals monitored. Pt compliant with medications. Pt supported emotionally and encouraged to express concerns and ask questions.   R. Pt remains safe with 15 minute checks. Will continue POC.

## 2018-11-23 MED ORDER — TRAZODONE HCL 100 MG PO TABS
100.0000 mg | ORAL_TABLET | Freq: Every day | ORAL | Status: DC
  Administered 2018-11-23 – 2018-11-25 (×3): 100 mg via ORAL
  Filled 2018-11-23 (×7): qty 1

## 2018-11-23 MED ORDER — CITALOPRAM HYDROBROMIDE 20 MG PO TABS
20.0000 mg | ORAL_TABLET | Freq: Every day | ORAL | Status: DC
  Administered 2018-11-24 – 2018-11-26 (×3): 20 mg via ORAL
  Filled 2018-11-23 (×5): qty 1

## 2018-11-23 NOTE — [facility] Group Notes (Signed)
[facility] Group Notes: (Clinical Social Work)   11/23/2018      Type of Therapy:  Group Therapy   Participation Level:  Did Not Attend - was invited both individually by MHT and by overhead announcement, chose not to attend.   Ghazal Pevey Grossman-Orr, LCSW 11/23/2018, 10:58 AM     

## 2018-11-23 NOTE — Progress Notes (Signed)
[place] NOVEL CORONAVIRUS (COVID-19) DAILY CHECK-OFF SYMPTOMS - answer yes or no to each - every day NO YES  Have you had a fever in the past 24 hours?  . Fever (Temp > 37.80C / 100F) X   Have you had any of these symptoms in the past 24 hours? . New Cough .  Sore Throat  .  Shortness of Breath .  Difficulty Breathing .  Unexplained Body Aches   X   Have you had any one of these symptoms in the past 24 hours not related to allergies?   . Runny Nose .  Nasal Congestion .  Sneezing   X   If you have had runny nose, nasal congestion, sneezing in the past 24 hours, has it worsened?  X   EXPOSURES - check yes or no X   Have you traveled outside the state in the past 14 days?  X   Have you been in contact with someone with a confirmed diagnosis of COVID-19 or PUI in the past 14 days without wearing appropriate PPE?  X   Have you been living in the same home as a person with confirmed diagnosis of COVID-19 or a PUI (household contact)?    X   Have you been diagnosed with COVID-19?    X              What to do next: Answered NO to all: Answered YES to anything:   Proceed with unit schedule Follow the [facility] Inpatient Flowsheet.   
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
## 2018-11-23 NOTE — [facility] Group Notes (Signed)
Adult Psychoeducational Group Note  Date:  11/23/2018 Time:  9:56 PM  Group Topic/Focus:  Wrap-Up Group:   The focus of this group is to help patients review their daily goal of treatment and discuss progress on daily workbooks.  Participation Level:  Active  Participation Quality:  Appropriate  Affect:  Appropriate  Cognitive:  Appropriate  Insight: Appropriate  Engagement in Group:  Engaged  Modes of Intervention:  Discussion  Additional Comments:  Pt stated her goal was to focus on taking one day at a time and stay focused on getting well.  Pt stated she did not meet her goal.  Pt rated the day at a 2/10.  Taiyana Kissler 11/23/2018, 9:56 PM

## 2018-11-23 NOTE — Progress Notes (Signed)
D.  Pt pleasant but anxious on approach, no complaints voiced.  Pt was positive for evening wrap up group, observed engaged appropriately with peers on the unit.  Pt denies SI/HI/AVH at this time.  A. Support and encouragement offered, medication given as ordered  R.  Pt remains safe on the unit, will continue to monitor.

## 2018-11-24 ENCOUNTER — Other Ambulatory Visit: Payer: Self-pay

## 2018-11-24 LAB — GLUCOSE, CAPILLARY
Glucose-Capillary: 113 mg/dL — ABNORMAL HIGH (ref 70–99)
Glucose-Capillary: 124 mg/dL — ABNORMAL HIGH (ref 70–99)
Glucose-Capillary: 107 mg/dL — ABNORMAL HIGH (ref 70–99)

## 2018-11-24 NOTE — Progress Notes (Addendum)
Spiritual care group on grief and loss facilitated by chaplain Jerene Pitch MDiv, [facility]  Group Goal:  Support / Education around grief and loss Members engage in facilitated group support and psycho-social education.  Group Description:  Following introductions and group rules, group members engaged in facilitated group dialog and support around topic of loss, with particular support around experiences of loss in their lives. Group Identified types of loss (relationships / self / things) and identified patterns, circumstances, and changes that precipitate losses. Reflected on thoughts / feelings around loss, normalized grief responses, and recognized variety in grief experience.   Group noted Worden's four tasks of grief in discussion.  Group drew on Adlerian / Rogerian, narrative, MI, Patient Progress:  Present throughout group.  Attentive to group conversation.  Did not engage in group dialog.

## 2018-11-24 NOTE — [facility] Group Notes (Signed)
LCSW Group Therapy Note 11/24/2018 3:12 PM  Type of Therapy and Topic: Group Therapy: Overcoming Obstacles  Participation Level: Active  Description of Group:  In this group patients will be encouraged to explore what they see as obstacles to their own wellness and recovery. They will be guided to discuss their thoughts, feelings, and behaviors related to these obstacles. The group will process together ways to cope with barriers, with attention given to specific choices patients can make. Each patient will be challenged to identify changes they are motivated to make in order to overcome their obstacles. This group will be process-oriented, with patients participating in exploration of their own experiences as well as giving and receiving support and challenge from other group members.  Therapeutic Goals: 1. Patient will identify personal and current obstacles as they relate to admission. 2. Patient will identify barriers that currently interfere with their wellness or overcoming obstacles.  3. Patient will identify feelings, thought process and behaviors related to these barriers. 4. Patient will identify two changes they are willing to make to overcome these obstacles:   Summary of Patient Progress  Melanie Riley was engaged and participated throughout the group session. Melanie Riley reports that her main obstacle is "I feel the staff is not caring enough". She reports that she felt that the nursing staff did not seem to care about her concerns. She also shared that she struggles forgiving herself for her suicidal ideation.     Therapeutic Modalities:  Cognitive Behavioral Therapy Solution Focused Therapy Motivational Interviewing Relapse Prevention Therapy   Theresa Duty Clinical Social Worker

## 2018-11-24 NOTE — Plan of Care (Signed)
Nurse discussed anxiety and coping skills with patient. 

## 2018-11-24 NOTE — Progress Notes (Signed)
Recreation Therapy Notes  Date:  11.23.20 Time: 0930 Location: [address] Dayroom  Group Topic: Stress Management  Goal Area(s) Addresses:  Patient will identify positive stress management techniques. Patient will identify benefits of using stress management post d/c.  Intervention: Stress Management  Activity :  Meditation.  LRT introduced the stress management technique of mountain meditation.  LRT played a meditation that focused on taking in the characteristics of a mountain.  Patients were to listen and follow along as meditation played to fully participate in activity.  Education:  Stress Management, Discharge Planning.   Education Outcome: Acknowledges Education  Clinical Observations/Feedback: Pt did not attend activity.    Nikhita Mentzel, LRT/CTRS         Kreston Ahrendt A 11/24/2018 11:29 AM 

## 2018-11-24 NOTE — Progress Notes (Signed)
D:  Patient's self inventory sheet, patient has fair sleep, sleep medication helpful.  Fair appetite, hyper energy level, good concentration.  Rated depression 6, hopeless and anxiety 8.  Denied withdrawals.  Denied SI.  Denied physical problems.  Denied physical pain.  Goal is hope and dealing with depression priority.  Plans to talk and read.  Does have discharge plans. A:  Medications administered per MD orders.  Emotional support and encouragement given patient. R:  Denied SI and HI, contracts for safety.  Denied A/V hallucinations.  Safety maintained with 15 minute checks.

## 2018-11-24 NOTE — Progress Notes (Signed)
EKG completed and given to MD for review.  
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
## 2018-11-24 NOTE — Progress Notes (Signed)
D.  Pt appears flat and depressed on approach, forwards little.  Pt did report good results today from the [facility] for her constipation.  Pt still coming to terms with her situation.  Pt denies SI/HI/AVH at this time.  Pt was positive for evening wrap up group, minimal but appropriate interaction on unit.  A.  Support and encouragement offered, medication given as ordered.  R.  Pt remains safe on the unit, will continue to monitor.

## 2018-11-25 LAB — BASIC METABOLIC PANEL
Anion gap: 8 (ref 5–15)
BUN: 14 mg/dL (ref 8–23)
CO2: 29 mmol/L (ref 22–32)
Calcium: 9.2 mg/dL (ref 8.9–10.3)
Chloride: 105 mmol/L (ref 98–111)
Creatinine, Ser: 0.74 mg/dL (ref 0.44–1.00)
GFR calc Af Amer: >60 mL/min (ref >60)
GFR calc non Af Amer: >60 mL/min (ref >60)
Glucose, Bld: 125 mg/dL — ABNORMAL HIGH (ref 70–99)
Potassium: 3.9 mmol/L (ref 3.5–5.1)
Sodium: 142 mmol/L (ref 135–145)

## 2018-11-25 LAB — GLUCOSE, CAPILLARY: Glucose-Capillary: 119 mg/dL — ABNORMAL HIGH (ref 70–99)
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
## 2018-11-25 NOTE — Progress Notes (Signed)
   11/25/18 2200  Psych Admission Type (Psych Patients Only)  Admission Status Voluntary  Psychosocial Assessment  Patient Complaints Anxiety;Depression  Eye Contact Brief  Facial Expression Blank;Flat  Affect Appropriate to circumstance;Depressed  Speech Logical/coherent;Soft  Interaction Avoidant;Cautious;Forwards little;Guarded;Minimal  Motor Activity Slow  Appearance/Hygiene Unremarkable  Behavior Characteristics Cooperative  Mood Depressed  Thought Process  Coherency WDL  Content Ambivalence  Delusions None reported or observed  Perception WDL  Hallucination None reported or observed  Judgment Poor  Confusion None  Danger to Self  Current suicidal ideation? Denies  Danger to Others  Danger to Others None reported or observed   Pt stated she was feeling a little better

## 2018-11-25 NOTE — Plan of Care (Addendum)
Progress note  D: pt found in bed; compliant with medication administration. Pt denies any physical complaints or pain. Pt does express concerns with increasing anxiety, but cannot express what is causing this. Pt is avoidant and minimal in their assessment. Pt is been in bed most of the day. Pt denies si/hi/ah/vh and verbally agrees to approach staff if these become apparent or before harming themself/others while at [facility].  A: Pt provided support and encouragement. Pt given medication per protocol and standing orders. Q67m safety checks implemented and continued.  R: Pt safe on the unit. Will continue to monitor.  Pt progressing in the following metrics  Problem: Education: Goal: Utilization of techniques to improve thought processes will improve Outcome: Progressing Goal: Knowledge of the prescribed therapeutic regimen will improve Outcome: Progressing   Problem: Coping: Goal: Coping ability will improve Outcome: Progressing   Problem: Education: Goal: Utilization of techniques to improve thought processes will improve Outcome: Progressing

## 2018-11-25 NOTE — Progress Notes (Signed)
Recreation Therapy Notes  Animal-Assisted Activity (AAA) Program Checklist/Progress Notes Patient Eligibility Criteria Checklist & Daily Group note for Rec Tx Intervention  Date: 11.24.20 Time: 1 Location: [address]  AAA/T Program Assumption of Risk Form signed by Teacher, music or Parent Legal Guardian  YES   Patient is free of allergies or sever asthma  YES   Patient reports no fear of animals  YES   Patient reports no history of cruelty to animals  YES   Patient understands his/her participation is voluntary YES   Patient washes hands before animal contact  YES   Patient washes hands after animal contact  YES   Behavioral Response: Engaged  Education: Contractor, Appropriate Animal Interaction   Education Outcome: Acknowledges understanding/In group clarification offered/Needs additional education.   Clinical Observations/Feedback:  Pt attended and participated in activity.   Victorino Sparrow, LRT/CTRS         Victorino Sparrow A 11/25/2018 3:31 PM

## 2018-11-25 NOTE — Progress Notes (Signed)
   11/25/18 0159  Psych Admission Type (Psych Patients Only)  Admission Status Voluntary  Psychosocial Assessment  Patient Complaints Anxiety;Depression  Eye Contact Fair  Facial Expression Flat  Affect Appropriate to circumstance  Speech Logical/coherent  Interaction Assertive  Motor Activity Other (Comment) (WDL)  Appearance/Hygiene Unremarkable  Behavior Characteristics Anxious  Mood Depressed  Thought Process  Coherency WDL  Content WDL  Delusions None reported or observed  Perception WDL  Hallucination None reported or observed  Judgment Poor  Confusion None  Danger to Self  Current suicidal ideation? Denies  Danger to Others  Danger to Others None reported or observed  D: Patient in dayroom reports appears anxious.  A: Medications administered as prescribed. Support and encouragement provided as needed.  R: Patient remains safe on the unit. Will continue to monitor for safety and stability.

## 2018-11-25 NOTE — [facility] Group Notes (Signed)
Adult Psychoeducational Group Note  Date:  11/25/2018 Time:  1:54 AM  Group Topic/Focus:  Wrap-Up Group:   The focus of this group is to help patients review their daily goal of treatment and discuss progress on daily workbooks.  Participation Level:  Active  Participation Quality:  Appropriate  Affect:  Appropriate  Cognitive:  Appropriate  Insight: Appropriate  Engagement in Group:  Engaged  Modes of Intervention:  Discussion  Additional Comments:  Pt stated her goal was to start trying to forgive herself for how things have happened.  Pt stated her goal is on-going.  Pt rated the day at a 6/10.  Renardo Cheatum 11/25/2018, 1:54 AM
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
## 2018-11-26 LAB — GLUCOSE, CAPILLARY: Glucose-Capillary: 114 mg/dL — ABNORMAL HIGH (ref 70–99)

## 2018-11-26 MED ORDER — LISINOPRIL 5 MG PO TABS: 5.0000 mg | ORAL_TABLET | Freq: Every day | ORAL | 0 refills | Status: AC

## 2018-11-26 MED ORDER — HYDROXYZINE HCL 25 MG PO TABS: 25.0000 mg | ORAL_TABLET | Freq: Three times a day (TID) | ORAL | 0 refills | Status: DC | PRN

## 2018-11-26 MED ORDER — POLYETHYLENE GLYCOL 3350 17 G PO PACK: 17.0000 g | PACK | Freq: Every day | ORAL | 0 refills | Status: AC | PRN

## 2018-11-26 MED ORDER — BUSPIRONE HCL 5 MG PO TABS: 5.0000 mg | ORAL_TABLET | Freq: Three times a day (TID) | ORAL | 0 refills | Status: DC

## 2018-11-26 MED ORDER — TRAZODONE HCL 100 MG PO TABS: 100.0000 mg | ORAL_TABLET | Freq: Every day | ORAL | 0 refills | Status: DC

## 2018-11-26 MED ORDER — CITALOPRAM HYDROBROMIDE 20 MG PO TABS: 20.0000 mg | ORAL_TABLET | Freq: Every day | ORAL | 0 refills | Status: DC

## 2018-11-26 NOTE — Progress Notes (Signed)
Adult Psychoeducational Group Note  Date:  11/26/2018 Time:  12:35 PM  Group Topic/Focus:  Identifying Needs:   The focus of this group is to help patients identify their personal needs that have been historically problematic and identify healthy behaviors to address their needs.  Participation Level:  Active  Participation Quality:  Appropriate  Affect:  Appropriate  Cognitive:  Alert  Insight: Appropriate  Engagement in Group:  Engaged  Modes of Intervention:  Discussion and Education  Additional Comments:    Pt participated in group with the MHT. Today's topic of the day is personal development. During group the MHT discussed Maslow's hierarchy of needs. As group staff and pts discussed the different levels of the hierarchy and if they have or have not met the requirements to advance to to the next stage. Pt's discussed issues they have at each level. As a group it was discussed how to overcome issues to attempt to reach the top goal of self actualization.   Lita Mains 11/26/2018, 12:35 PM

## 2018-11-26 NOTE — Progress Notes (Signed)
  [facility] Adult Case Management Discharge Plan :  Will you be returning to the same living situation after discharge:  Yes,  home. At discharge, do you have transportation home?: Yes,  husband will pick up at 12:00pm. Do you have the ability to pay for your medications: Yes,  [employer] Medicare  Release of information consent forms completed and in the chart; letters on chart.  Patient to Follow up at: Follow-up Information    BE[facility]IORAL [facility] PARTIAL HOSPITALIZATION [facility]GRAM Follow up on 12/02/2018.   Specialty: Behavioral Health Why: Your intake assessment is scheduled for 12/01 at 10:00am via WebEx. Please login to your email prior to your appointment for the link. Call if you have questions! Contact information: [place] [id number] [place] [phone]          Next level of care provider has access to [facility] and Suicide Prevention discussed: Yes,  with husband, Kerry Dory.   Has patient been referred to the Quitline?: N/A patient is not a smoker  Patient has been referred for addiction treatment: Yes  Joellen Jersey, [place] 11/26/2018, 10:47 AM
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
## 2018-11-26 NOTE — Progress Notes (Signed)
Patient ID: Melanie Riley, female   DOB: [birth year], 63 y.o.   MRN: [id number] Patient discharged to home/self care on her own accord.  Patient denies SI, HI and AVH upon discharge.  Patient acknowledges understanding of all discharge instructions and receipt of personal belongings.

## 2018-11-26 NOTE — BHH Suicide Risk Assessment (Signed)
[facility] Discharge Suicide Risk Assessment   Principal Problem: Major depressive disorder, recurrent, severe with psychotic features [facility]) Discharge Diagnoses: Principal Problem:   Major depressive disorder, recurrent, severe with psychotic features (HCC)   Total Time spent with patient: 30 minutes  Musculoskeletal: Strength & Muscle Tone: within normal limits Gait & Station: normal Patient leans: N/A  Psychiatric Specialty Exam: ROS no chest pain, no shortness of breath, no cough, no vomiting  Blood pressure 117/87, pulse 83, temperature (!) 97.3 F (36.3 C), temperature source Oral, resp. rate 16, height 5\' 4"  (1.626 m), weight 61.8 kg, SpO2 97 %.Body mass index is 23.39 kg/m.  General Appearance: Well Groomed  Eye Contact::  Good  Speech:  Normal Rate409  Volume:  Normal  Mood:  Reports feeling better than she did on admission, describes improved mood  Affect:  More reactive today, smiles briefly at times appropriately  Thought Process:  Linear and Descriptions of Associations: Intact  Orientation:  Full (Time, Place, and Person)  Thought Content:  Denies hallucinations, no delusions are expressed  Suicidal Thoughts:  No currently denies any suicidal or self-injurious ideations, denies any homicidal or violent ideations  Homicidal Thoughts:  No  Memory:  Recent and remote grossly intact  Judgement:  Other:  Improving  Insight:  Fair/improving  Psychomotor Activity:  Normal  Concentration:  Good  Recall:  Good  Fund of Knowledge:Good  Language: Good  Akathisia:  Negative  Handed:  Right  AIMS (if indicated):     Assets:  Desire for Improvement Resilience Social Support  Sleep:  Number of Hours: 6.5  Cognition: WNL  ADL's:  Intact   Mental Status Per Nursing Assessment::   On Admission:  NA  Demographic Factors:  63 year old married female  Loss Factors: Reports she was recently involved in an arson scheme, facing legal charges due to this  Historical  Factors: History of anxiety, no prior psychiatric admissions, no prior history of suicide attempts  Risk Reduction Factors:   Sense of responsibility to family, Living with another person, especially a relative, Positive social support and Positive coping skills or problem solving skills  Reports her husband/family as very supportive  Continued Clinical Symptoms:  Today patient is alert, attentive, pleasant, cooperative, calm without psychomotor agitation or restlessness.  Reports her mood as improving.  Affect also seems improved, more reactive today.  No thought disorder.  Denies suicidal or self-injurious ideations.  Denies homicidal or violent ideations.  No hallucinations, no delusions. Denies SI and establishes protective factors to include a sense of responsibility to her family, a supportive husband and family.  Currently denies medication side effects. Behavior on unit in good control, visible in dayroom, pleasant on approach.  Cognitive Features That Contribute To Risk:  No gross cognitive deficits noted upon discharge. Is alert , attentive, and oriented x 3    Suicide Risk:  Mild:  Suicidal ideation of limited frequency, intensity, duration, and specificity.  There are no identifiable plans, no associated intent, mild dysphoria and related symptoms, good self-control (both objective and subjective assessment), few other risk factors, and identifiable protective factors, including available and accessible social support.  Follow-up Information    BEHAVIORAL HEALTH PARTIAL HOSPITALIZATION PROGRAM Follow up on 12/02/2018.   Specialty: Behavioral Health Why: Your intake assessment is scheduled for 12/01 at 10:00am via WebEx. Please login to your email prior to your appointment for the link. Call if you have questions! Contact information: [address] [employer] [place] [phone]  Plan Of Care/Follow-up recommendations:   Activity:  As tolerated Diet:  Heart healthy Tests:  NA Other:  See below  Patient reports readiness for discharge and there are no current grounds for involuntary commitment.  Plans to return home.  She plans to participate in the [place] starting 12/1. Describes family as supportive , states her husband will be picking her up later.   Jenne Campus, MD 11/26/2018, 11:09 AM

## 2018-11-26 NOTE — Tx Team (Addendum)
Interdisciplinary Treatment and Diagnostic Plan Update  11/26/2018 Time of Session: 9:00am Melanie Riley MRN: [id number]  Principal Diagnosis: Major depressive disorder, recurrent, severe with psychotic features [facility])  Secondary Diagnoses: Principal Problem:   Major depressive disorder, recurrent, severe with psychotic features ([place])   Current Medications:  Current Facility-Administered Medications  Medication Dose Route Frequency Provider Last Rate Last Dose  . acetaminophen (TYLENOL) tablet 650 mg  650 mg Oral Q6H PRN Anike, Adaku C, NP   650 mg at 11/25/18 2142  . alum & mag hydroxide-simeth (MAALOX/MYLANTA) 200-200-20 MG/5ML suspension 30 mL  30 mL Oral Q4H PRN Anike, Adaku C, NP      . busPIRone (BUSPAR) tablet 5 mg  5 mg Oral TID Sharma Covert, MD   5 mg at 11/26/18 0738  . citalopram (CELEXA) tablet 20 mg  20 mg Oral Daily Nwoko, Agnes I, NP   20 mg at 11/26/18 0738  . hydrOXYzine (ATARAX/VISTARIL) tablet 25 mg  25 mg Oral TID PRN Anike, Adaku C, NP   25 mg at 11/25/18 2142  . lisinopril (ZESTRIL) tablet 5 mg  5 mg Oral Daily Lindell Spar I, NP   5 mg at 11/26/18 0738  . magnesium hydroxide (MILK OF MAGNESIA) suspension 30 mL  30 mL Oral Daily PRN Anike, Adaku C, NP      . polyethylene glycol (MIRALAX / GLYCOLAX) packet 17 g  17 g Oral Daily PRN Lindon Romp A, NP   17 g at 11/24/18 2035  . traZODone (DESYREL) tablet 100 mg  100 mg Oral QHS Lindell Spar I, NP   100 mg at 11/25/18 2126   PTA Medications: Medications Prior to Admission  Medication Sig Dispense Refill Last Dose  . acetaminophen (TYLENOL) 325 MG tablet Take 650 mg by mouth every 6 (six) hours as needed for mild pain or headache.       Patient Stressors: Legal issue Occupational concerns  Patient Strengths: Ability for insight Average or above average intelligence Communication skills Motivation for treatment/growth Physical Health  Treatment Modalities: Medication Management, Group therapy, Case  management,  1 to 1 session with clinician, Psychoeducation, Recreational therapy.   Physician Treatment Plan for Primary Diagnosis: Major depressive disorder, recurrent, severe with psychotic features ([place]) Long Term Goal(s): Improvement in symptoms so as ready for discharge Improvement in symptoms so as ready for discharge   Short Term Goals: Ability to identify changes in lifestyle to reduce recurrence of condition will improve Ability to verbalize feelings will improve Ability to disclose and discuss suicidal ideas Ability to demonstrate self-control will improve Ability to identify and develop effective coping behaviors will improve Ability to maintain clinical measurements within normal limits will improve Ability to identify changes in lifestyle to reduce recurrence of condition will improve Ability to verbalize feelings will improve Ability to disclose and discuss suicidal ideas Ability to demonstrate self-control will improve Ability to identify and develop effective coping behaviors will improve Ability to maintain clinical measurements within normal limits will improve  Medication Management: Evaluate patient's response, side effects, and tolerance of medication regimen.  Therapeutic Interventions: 1 to 1 sessions, Unit Group sessions and Medication administration.  Evaluation of Outcomes: Adequate for Discharge  Physician Treatment Plan for Secondary Diagnosis: Principal Problem:   Major depressive disorder, recurrent, severe with psychotic features ([place])  Long Term Goal(s): Improvement in symptoms so as ready for discharge Improvement in symptoms so as ready for discharge   Short Term Goals: Ability to identify changes in lifestyle to reduce recurrence  of condition will improve Ability to verbalize feelings will improve Ability to disclose and discuss suicidal ideas Ability to demonstrate self-control will improve Ability to identify and develop effective coping  behaviors will improve Ability to maintain clinical measurements within normal limits will improve Ability to identify changes in lifestyle to reduce recurrence of condition will improve Ability to verbalize feelings will improve Ability to disclose and discuss suicidal ideas Ability to demonstrate self-control will improve Ability to identify and develop effective coping behaviors will improve Ability to maintain clinical measurements within normal limits will improve     Medication Management: Evaluate patient's response, side effects, and tolerance of medication regimen.  Therapeutic Interventions: 1 to 1 sessions, Unit Group sessions and Medication administration.  Evaluation of Outcomes: Adequate for Discharge   RN Treatment Plan for Primary Diagnosis: Major depressive disorder, recurrent, severe with psychotic features (HCC) Long Term Goal(s): Knowledge of disease and therapeutic regimen to maintain health will improve  Short Term Goals: Ability to disclose and discuss suicidal ideas and Ability to identify and develop effective coping behaviors will improve  Medication Management: RN will administer medications as ordered by provider, will assess and evaluate patient's response and provide education to patient for prescribed medication. RN will report any adverse and/or side effects to prescribing provider.  Therapeutic Interventions: 1 on 1 counseling sessions, Psychoeducation, Medication administration, Evaluate responses to treatment, Monitor vital signs and CBGs as ordered, Perform/monitor CIWA, COWS, AIMS and Fall Risk screenings as ordered, Perform wound care treatments as ordered.  Evaluation of Outcomes: Adequate for Discharge   LCSW Treatment Plan for Primary Diagnosis: Major depressive disorder, recurrent, severe with psychotic features (HCC) Long Term Goal(s): Safe transition to appropriate next level of care at discharge, Engage patient in therapeutic group addressing  interpersonal concerns.  Short Term Goals: Engage patient in aftercare planning with referrals and resources, Increase social support, Increase emotional regulation, Identify triggers associated with mental health/substance abuse issues and Increase skills for wellness and recovery  Therapeutic Interventions: Assess for all discharge needs, 1 to 1 time with Social worker, Explore available resources and support systems, Assess for adequacy in community support network, Educate family and significant other(s) on suicide prevention, Complete Psychosocial Assessment, Interpersonal group therapy.  Evaluation of Outcomes: Adequate for Discharge   Progress in Treatment: Attending groups: Yes.  Participating in groups: Yes. Taking medication as prescribed: Yes. Toleration medication: Yes. Family/Significant other contact made: Yes, individual(s) contacted:  husband. Patient understands diagnosis: Yes. Discussing patient identified problems/goals with staff: Yes. Medical problems stabilized or resolved: Yes. Denies suicidal/homicidal ideation: No. Issues/concerns per patient self-inventory: Yes.  New problem(s) identified: Yes, Describe:  legal issues, financial stressors.  New Short Term/Long Term Goal(s): medication management for mood stabilization; elimination of SI thoughts; development of comprehensive mental wellness/sobriety plan.  Patient Goals:  Discharge Plan or Barriers: Patient will return home with family will start PHP through [facility] on 12/01.  Reason for Continuation of Hospitalization: Anxiety Depression Medication stabilization  Estimated Length of Stay: discharge today  Attendees: Patient: Melanie Riley 11/26/2018 10:40 AM  Physician: Marguerita Merles  Dr.Cobos 11/26/2018 10:40 AM  Nursing: Ethelene Browns, RN 11/26/2018 10:40 AM  RN Care Manager: 11/26/2018 10:40 AM  Social Worker: Enid Cutter, LCSWA 11/26/2018 10:40 AM  Recreational Therapist:  11/26/2018 10:40 AM  Other:  Idamae Schuller, NP  11/26/2018 10:40 AM  Other:  11/26/2018 10:40 AM  Other: 11/26/2018 10:40 AM    Scribe for Treatment Team: Darreld Mclean, LCSWA 11/26/2018 10:40 AM

## 2018-12-01 ENCOUNTER — Telehealth (HOSPITAL_COMMUNITY): Payer: Self-pay | Admitting: Professional

## 2018-12-02 ENCOUNTER — Other Ambulatory Visit: Payer: Self-pay

## 2018-12-02 ENCOUNTER — Other Ambulatory Visit (HOSPITAL_COMMUNITY): Payer: Medicare HMO | Attending: Psychiatry | Admitting: Licensed Clinical Social Worker

## 2018-12-02 DIAGNOSIS — I10 Essential (primary) hypertension: Secondary | ICD-10-CM | POA: Insufficient documentation

## 2018-12-02 DIAGNOSIS — R4589 Other symptoms and signs involving emotional state: Secondary | ICD-10-CM | POA: Diagnosis not present

## 2018-12-02 DIAGNOSIS — Z658 Other specified problems related to psychosocial circumstances: Secondary | ICD-10-CM | POA: Diagnosis not present

## 2018-12-02 DIAGNOSIS — Z79899 Other long term (current) drug therapy: Secondary | ICD-10-CM | POA: Insufficient documentation

## 2018-12-02 DIAGNOSIS — F333 Major depressive disorder, recurrent, severe with psychotic symptoms: Secondary | ICD-10-CM

## 2018-12-02 DIAGNOSIS — F419 Anxiety disorder, unspecified: Secondary | ICD-10-CM | POA: Insufficient documentation

## 2018-12-02 DIAGNOSIS — R45850 Homicidal ideations: Secondary | ICD-10-CM | POA: Diagnosis not present

## 2018-12-02 DIAGNOSIS — F332 Major depressive disorder, recurrent severe without psychotic features: Secondary | ICD-10-CM | POA: Insufficient documentation

## 2018-12-02 DIAGNOSIS — Z90710 Acquired absence of both cervix and uterus: Secondary | ICD-10-CM | POA: Diagnosis not present

## 2018-12-02 DIAGNOSIS — R69 Illness, unspecified: Secondary | ICD-10-CM | POA: Diagnosis not present

## 2018-12-02 DIAGNOSIS — R45851 Suicidal ideations: Secondary | ICD-10-CM | POA: Diagnosis not present

## 2018-12-03 ENCOUNTER — Other Ambulatory Visit: Payer: Self-pay

## 2018-12-03 ENCOUNTER — Telehealth (HOSPITAL_COMMUNITY): Payer: Self-pay | Admitting: Professional

## 2018-12-03 ENCOUNTER — Other Ambulatory Visit (HOSPITAL_COMMUNITY): Payer: Medicare HMO | Admitting: Licensed Clinical Social Worker

## 2018-12-03 DIAGNOSIS — F332 Major depressive disorder, recurrent severe without psychotic features: Secondary | ICD-10-CM | POA: Diagnosis not present

## 2018-12-03 DIAGNOSIS — F333 Major depressive disorder, recurrent, severe with psychotic symptoms: Secondary | ICD-10-CM

## 2018-12-03 NOTE — Psych (Signed)
Virtual Visit via Video Note  I connected with Melanie Riley on 12/03/18 at  9:00 AM EST by a video enabled telemedicine application and verified that I am speaking with the correct person using two identifiers.   I discussed the limitations of evaluation and management by telemedicine and the availability of in person appointments. The patient expressed understanding and agreed to proceed.    I discussed the assessment and treatment plan with the patient. The patient was provided an opportunity to ask questions and all were answered. The patient agreed with the plan and demonstrated an understanding of the instructions.   The patient was advised to call back or seek an in-person evaluation if the symptoms worsen or if the condition fails to improve as anticipated.  I provided 10 minutes of non-face-to-face time during this encounter.  Pt verbally agrees to treatment plan.  Royetta Crochet, [facility]
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
## 2018-12-04 ENCOUNTER — Other Ambulatory Visit (HOSPITAL_COMMUNITY): Payer: Medicare HMO | Admitting: Licensed Clinical Social Worker

## 2018-12-04 ENCOUNTER — Other Ambulatory Visit: Payer: Self-pay

## 2018-12-04 ENCOUNTER — Other Ambulatory Visit (HOSPITAL_COMMUNITY): Payer: Medicare HMO | Admitting: Occupational Therapy

## 2018-12-04 DIAGNOSIS — F333 Major depressive disorder, recurrent, severe with psychotic symptoms: Secondary | ICD-10-CM

## 2018-12-04 DIAGNOSIS — F332 Major depressive disorder, recurrent severe without psychotic features: Secondary | ICD-10-CM | POA: Diagnosis not present

## 2018-12-04 DIAGNOSIS — R4589 Other symptoms and signs involving emotional state: Secondary | ICD-10-CM

## 2018-12-05 ENCOUNTER — Other Ambulatory Visit: Payer: Self-pay

## 2018-12-05 ENCOUNTER — Other Ambulatory Visit (HOSPITAL_COMMUNITY): Payer: Medicare HMO | Admitting: Licensed Clinical Social Worker

## 2018-12-05 ENCOUNTER — Encounter (HOSPITAL_COMMUNITY): Payer: Self-pay | Admitting: Family

## 2018-12-05 ENCOUNTER — Other Ambulatory Visit (HOSPITAL_COMMUNITY): Payer: Medicare HMO

## 2018-12-05 DIAGNOSIS — F332 Major depressive disorder, recurrent severe without psychotic features: Secondary | ICD-10-CM | POA: Diagnosis not present
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
## 2018-12-08 ENCOUNTER — Other Ambulatory Visit (HOSPITAL_COMMUNITY): Payer: Medicare HMO | Admitting: Licensed Clinical Social Worker

## 2018-12-08 ENCOUNTER — Other Ambulatory Visit: Payer: Self-pay

## 2018-12-08 DIAGNOSIS — F332 Major depressive disorder, recurrent severe without psychotic features: Secondary | ICD-10-CM

## 2018-12-08 NOTE — Progress Notes (Signed)
Spoke with patient via Webex video call, used 2 identifiers to correctly identify patient. She states that group is going OK but she doesn't really feel like it relates to what she is going through. On scale 1-10 as 10 being worst she rates depression at 7/8 and anxiety at 8. Denies SI/HI or AV hallucinations. She is having trouble sleeping with the Trazodone. She will take one of her Vistaril at night to see if this helps her sleep. No side effects from medications. No issues or complaints.

## 2018-12-09 ENCOUNTER — Encounter (HOSPITAL_COMMUNITY): Payer: Self-pay

## 2018-12-09 ENCOUNTER — Other Ambulatory Visit (HOSPITAL_COMMUNITY): Payer: Medicare HMO | Admitting: Licensed Clinical Social Worker

## 2018-12-09 ENCOUNTER — Other Ambulatory Visit (HOSPITAL_COMMUNITY): Payer: Medicare HMO | Admitting: Occupational Therapy

## 2018-12-09 ENCOUNTER — Other Ambulatory Visit: Payer: Self-pay

## 2018-12-09 DIAGNOSIS — F332 Major depressive disorder, recurrent severe without psychotic features: Secondary | ICD-10-CM

## 2018-12-09 DIAGNOSIS — R4589 Other symptoms and signs involving emotional state: Secondary | ICD-10-CM

## 2018-12-09 MED ORDER — HYDROXYZINE HCL 25 MG PO TABS: 25.0000 mg | ORAL_TABLET | Freq: Three times a day (TID) | ORAL | 0 refills | Status: DC | PRN

## 2018-12-09 MED ORDER — TRAZODONE HCL 100 MG PO TABS: 100.0000 mg | ORAL_TABLET | Freq: Every day | ORAL | 0 refills | Status: DC

## 2018-12-09 MED ORDER — BUSPIRONE HCL 5 MG PO TABS: 5.0000 mg | ORAL_TABLET | Freq: Three times a day (TID) | ORAL | 0 refills | Status: DC

## 2018-12-09 MED ORDER — CITALOPRAM HYDROBROMIDE 20 MG PO TABS: 20.0000 mg | ORAL_TABLET | Freq: Every day | ORAL | 0 refills | Status: DC
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
## 2018-12-09 NOTE — Progress Notes (Signed)
Virtual Visit via Video Note  I connected with Melanie Riley on 12/09/18 at  9:00 AM EST by a video enabled telemedicine application and verified that I am speaking with the correct person using two identifiers.   I discussed the limitations of evaluation and management by telemedicine and the availability of in person appointments. The patient expressed understanding and agreed to proceed.  I discussed the assessment and treatment plan with the patient. The patient was provided an opportunity to ask questions and all were answered. The patient agreed with the plan and demonstrated an understanding of the instructions.   The patient was advised to call back or seek an in-person evaluation if the symptoms worsen or if the condition fails to improve as anticipated.  I provided 15 minutes of non-face-to-face time during this encounter.   Oneta Rackanika N Zaryah Seckel, NP     BH MD/PA/NP OP Progress Note  12/09/2018 10:23 AM Melanie Riley  MRN:  [id number]   Evaluation: Melanie Hashimotoatricia was evaluated via teleassessment.  She continues to present flat, guarded but pleasant.  Denying suicidal or homicidal ideations.  Denies auditory or visual hallucinations.  Reporting she continues to vacillate between group therapy and individual therapy sessions.  Patient continues to be reluctant with discussing stressors and is very vague in her responses.  Rates her depression 6 out of 10 with 10 being the worst.  Reported taking medications as directed.  Patient reports sleeping somewhat better with medication.  She reports some racing thoughts at night and restless sleep.  States it has improved since she has taken trazodone and Vistaril together.  Discussed medication refills.  We will continue to monitor for symptoms.  Support, encouragement and  reassurance was provided.  Visit Diagnosis: No diagnosis found.  Past Psychiatric History:   Past Medical History:  Past Medical History:  Diagnosis Date  . Anxiety    . Depression   . Hypertension     Past Surgical History:  Procedure Laterality Date  . ABDOMINAL HYSTERECTOMY      Family Psychiatric History:  Family History:  Family History  Problem Relation Age of Onset  . Alcohol abuse Sister   . Drug abuse Sister   . Alcohol abuse Brother   . Drug abuse Brother     Social History:  Social History   Socioeconomic History  . Marital status: Married    Spouse name: Not on file  . Number of children: 3  . Years of education: Not on file  . Highest education level: Not on file  Occupational History  . Not on file  Social Needs  . Financial resource strain: Very hard  . Food insecurity    Worry: Never true    Inability: Never true  . Transportation needs    Medical: No    Non-medical: No  Tobacco Use  . Smoking status: Never Smoker  . Smokeless tobacco: Never Used  Substance and Sexual Activity  . Alcohol use: Not Currently  . Drug use: Never  . Sexual activity: Not Currently    Comment: Hx of histerectomy  Lifestyle  . Physical activity    Days per week: 0 days    Minutes per session: Not on file  . Stress: Very much  Relationships  . Social Musicianconnections    Talks on phone: Twice a week    Gets together: Never    Attends religious service: 1 to 4 times per year    Active member of club or organization: No  Attends meetings of clubs or organizations: Never    Relationship status: Married  Other Topics Concern  . Not on file  Social History Narrative  . Not on file    Allergies: No Known Allergies  Metabolic Disorder Labs: Lab Results  Component Value Date   HGBA1C 6.0 (H) 11/21/2018   MPG 125.5 11/21/2018   No results found for: PROLACTIN Lab Results  Component Value Date   CHOL 193 11/21/2018   TRIG 144 11/21/2018   HDL 33 (L) 11/21/2018   CHOLHDL 5.8 11/21/2018   VLDL 29 11/21/2018   LDLCALC 131 (H) 11/21/2018   Lab Results  Component Value Date   TSH 3.557 11/21/2018    Therapeutic Level  Labs: No results found for: LITHIUM No results found for: VALPROATE No components found for:  CBMZ  Current Medications: Current Outpatient Medications  Medication Sig Dispense Refill  . busPIRone (BUSPAR) 5 MG tablet Take 1 tablet (5 mg total) by mouth 3 (three) times daily. For anxiety 90 tablet 0  . citalopram (CELEXA) 20 MG tablet Take 1 tablet (20 mg total) by mouth daily. For depression 30 tablet 0  . hydrOXYzine (ATARAX/VISTARIL) 25 MG tablet Take 1 tablet (25 mg total) by mouth 3 (three) times daily as needed for anxiety. 75 tablet 0  . lisinopril (ZESTRIL) 5 MG tablet Take 1 tablet (5 mg total) by mouth daily. For high blood pressure 30 tablet 0  . polyethylene glycol (MIRALAX / GLYCOLAX) 17 g packet Take 17 g by mouth daily as needed. (May buy from over the counter): Constipation 14 each 0  . traZODone (DESYREL) 100 MG tablet Take 1 tablet (100 mg total) by mouth at bedtime. For sleep 30 tablet 0   No current facility-administered medications for this visit.      Musculoskeletal: Teleassessment  Psychiatric Specialty Exam: ROS  There were no vitals taken for this visit.There is no height or weight on file to calculate BMI.  General Appearance: Casual  Eye Contact:  Fair  Speech:  Clear and Coherent  Volume:  Decreased  Mood:  Anxious and Depressed  Affect:  Congruent  Thought Process:  Coherent  Orientation:  Full (Time, Place, and Person)  Thought Content: Logical   Suicidal Thoughts:  No  Homicidal Thoughts:  No  Memory:  Immediate;   Fair Recent;   Fair  Judgement:  Fair  Insight:  Fair  Psychomotor Activity:  Normal  Concentration:  Concentration: Fair  Recall:  [employer] of Knowledge: Fair  Language: Fair  Akathisia:  No  Handed:  Right  AIMS (if indicated):   Assets:  Communication Skills Desire for Improvement Resilience Social Support  ADL's:  Intact  Cognition: WNL  Sleep:  Fair reported improving    Screenings: AIMS     Admission  (Discharged) from 11/20/2018 in [facility]  AIMS Total Score  0    AUDIT     Admission (Discharged) from 11/20/2018 in [facility]  Alcohol Use Disorder Identification Test Final Score (AUDIT)  0    PHQ2-9     Counselor from 12/05/2018 in [facility]  PHQ-2 Total Score  6  PHQ-9 Total Score  25       Assessment and Plan:  Continue partial hospitalization programming Medications was refilled  Treatment plan was reviewed and agreed upon by NP T. Bobby Rumpf and patient Chantalle "Langley Gauss" Kannon need for continued group services.   Derrill Center,  NP 12/09/2018, 10:23 AM

## 2018-12-10 ENCOUNTER — Encounter (HOSPITAL_COMMUNITY): Payer: Self-pay | Admitting: Occupational Therapy

## 2018-12-10 ENCOUNTER — Other Ambulatory Visit: Payer: Self-pay

## 2018-12-10 ENCOUNTER — Other Ambulatory Visit (HOSPITAL_COMMUNITY): Payer: Medicare HMO | Admitting: Licensed Clinical Social Worker

## 2018-12-10 DIAGNOSIS — F332 Major depressive disorder, recurrent severe without psychotic features: Secondary | ICD-10-CM

## 2018-12-10 NOTE — Progress Notes (Signed)
Pt attended spiritual care group     11:00-12:00.  Group met via web-ex due to COVID-19 precautions.  Group facilitated by Simone Curia, MDiv, Scott   Group focused on topic of "community."  Members reflected on topic in facilitated dialog, identifying responses to topic and notions they hold of community from their previous experience.  Group members utilized value sort cards to identify top qualities they look for in community.  Engaged in facilitated dialog around their value choices, noting origin of these values, how these are realized in their lives, and strategies for engaging these values.    Spiritual care group drew on Motivational Interviewing, Narrative and Adlerian modalities.  Melanie Riley) was present throughout group.  Engaged in discussion with facilitator prompting.   She selected the values of "self-knowledge, Self-control, self-acceptance, tradition (naming family and work), and inner peace"   Melanie focused on the theme of forgiving herself as a precursor to connecting with others.  Stated it is challenging to hear affirmation from others who support her - noting she feels that their affirmation is not true.  When thinking of things she values about herself, she described that she had "always supported others and been a go-getter."  Reflected with other group member on offering herself support and care as she offered to others.

## 2018-12-10 NOTE — Therapy (Signed)
[facility] PARTIAL HOSPITALIZATION PROGRAM [address] [place], [place], [address] Phone: [phone]   Fax:  [phone]  Occupational Therapy Treatment  Patient Details  Name: Melanie Riley MRN: [id number] Date of Birth: [birth year] Referring Provider (OT): Hillery Jacks, NP  Virtual Visit via Video Note  I connected with Melanie Riley on 12/10/18 at  8:00 AM EST by a video enabled telemedicine application and verified that I am speaking with the correct person using two identifiers.   I discussed the limitations of evaluation and management by telemedicine and the availability of in person appointments. The patient expressed understanding and agreed to proceed.  I discussed the assessment and treatment plan with the patient. The patient was provided an opportunity to ask questions and all were answered. The patient agreed with the plan and demonstrated an understanding of the instructions.   The patient was advised to call back or seek an in-person evaluation if the symptoms worsen or if the condition fails to improve as anticipated.  I provided 60 minutes of non-face-to-face time during this encounter.   Dalphine Handing, OT    Encounter Date: 12/09/2018  OT End of Session - 12/10/18 1516    Visit Number  2    Number of Visits  12    Date for OT Re-Evaluation  01/06/19    Authorization Type  Aeta MCR    OT Start Time  1100    OT Stop Time  1200    OT Time Calculation (min)  60 min    Activity Tolerance  Patient tolerated treatment well    Behavior During Therapy  WFL for tasks assessed/performed       Past Medical History:  Diagnosis Date  . Anxiety   . Depression   . Hypertension     Past Surgical History:  Procedure Laterality Date  . ABDOMINAL HYSTERECTOMY      There were no vitals filed for this visit.  Subjective Assessment - 12/10/18 1516    Currently in Pain?  No/denies          S: "I am still figuring out my  coping skills"  O: Stress management tool worksheet discussed to educate on unhealthy vs healthy coping skills to manage stress to improve community integration. Coping strategies taught include: relaxation based- deep breathing, counting to 10, taking a 1 minute vacation, acceptance, stress balls, relaxation audio/video, visual/mental imagery. Positive mental attitude- gratitude, acceptance, cognitive reframing, positive self talk, anger management.  A: Pt presents with flat affect, withdrawn and needing cues to engage. She shares that she needs to further explore coping skills. She did not elaborate or state any skills she wanted to work on this date and seemed very distracted.  P: OT group will be x3 per week while pt in PHP              OT Education - 12/10/18 1516    Education Details  education given on stress management    Person(s) Educated  Patient    Methods  Explanation;Handout    Comprehension  Verbalized understanding;Returned demonstration       OT Short Term Goals - 12/10/18 1517      OT SHORT TERM GOAL #1   Title  Pt will be educated on strategies to improve psychosocial skills needed to participate fully in all daily, work, and leisure activities    Time  4    Period  Weeks    Status  On-going  Target Date  01/06/19      OT SHORT TERM GOAL #2   Title  Pt will apply psychosocial skills and coping mechanisms to daily activities in order to function independently and reintegrate into community    Time  4    Period  Weeks    Status  On-going      OT SHORT TERM GOAL #3   Title  Pt will engage in goal setting to improve functional BADL/IADL routine upon reintegrating into community.    Time  4    Period  Weeks    Status  On-going      OT SHORT TERM GOAL #4   Title  Pt will recall and/or apply 1-3 sleep hygiene strategies to improve function in BADL routine upon reintegrating into community    Time  4    Period  Weeks    Status  On-going                Plan - 12/10/18 1516    Occupational performance deficits (Please refer to evaluation for details):  ADL's;IADL's;Rest and Sleep;Work;Leisure;Social Participation    Body Structure / Function / Physical Skills  ADL;IADL    Cognitive Skills  Energy/Drive    Psychosocial Skills  Coping Strategies;Habits;Interpersonal Interaction;Routines and Behaviors       Patient will benefit from skilled therapeutic intervention in order to improve the following deficits and impairments:   Body Structure / Function / Physical Skills: ADL, IADL Cognitive Skills: Energy/Drive Psychosocial Skills: Coping Strategies, Habits, Interpersonal Interaction, Routines and Behaviors   Visit Diagnosis: Severe episode of recurrent major depressive disorder, without psychotic features ([place])  Difficulty coping    Problem List Patient Active Problem List   Diagnosis Date Noted  . Major depressive disorder, recurrent, severe with psychotic features ([place]) 11/20/2018   Zenovia Jarred, MSOT, OTR/L Behavioral Health OT/ Acute Relief OT PHP Office: [phone]  Zenovia Jarred 12/10/2018, 3:19 PM  [facility] PARTIAL HOSPITALIZATION PROGRAM [place], [place], [address] Phone: [phone]   Fax:  [phone]  Name: Melanie Riley MRN: [id number] Date of Birth: Jan 07, 1955

## 2018-12-11 ENCOUNTER — Other Ambulatory Visit: Payer: Self-pay

## 2018-12-11 ENCOUNTER — Other Ambulatory Visit (HOSPITAL_COMMUNITY): Payer: Medicare HMO | Admitting: Licensed Clinical Social Worker

## 2018-12-11 ENCOUNTER — Other Ambulatory Visit (HOSPITAL_COMMUNITY): Payer: Medicare HMO | Admitting: Occupational Therapy

## 2018-12-11 DIAGNOSIS — F332 Major depressive disorder, recurrent severe without psychotic features: Secondary | ICD-10-CM | POA: Diagnosis not present

## 2018-12-11 DIAGNOSIS — R4589 Other symptoms and signs involving emotional state: Secondary | ICD-10-CM

## 2018-12-12 ENCOUNTER — Encounter (HOSPITAL_COMMUNITY): Payer: Self-pay | Admitting: Family

## 2018-12-12 ENCOUNTER — Other Ambulatory Visit (HOSPITAL_COMMUNITY): Payer: Medicare HMO | Admitting: Occupational Therapy

## 2018-12-12 ENCOUNTER — Encounter (HOSPITAL_COMMUNITY): Payer: Self-pay

## 2018-12-12 ENCOUNTER — Encounter (HOSPITAL_COMMUNITY): Payer: Self-pay | Admitting: Occupational Therapy

## 2018-12-12 ENCOUNTER — Other Ambulatory Visit (HOSPITAL_COMMUNITY): Payer: Medicare HMO | Admitting: Licensed Clinical Social Worker

## 2018-12-12 ENCOUNTER — Other Ambulatory Visit: Payer: Self-pay

## 2018-12-12 DIAGNOSIS — F332 Major depressive disorder, recurrent severe without psychotic features: Secondary | ICD-10-CM

## 2018-12-12 DIAGNOSIS — R4589 Other symptoms and signs involving emotional state: Secondary | ICD-10-CM

## 2018-12-12 MED ORDER — TRAZODONE HCL 100 MG PO TABS: 100.0000 mg | ORAL_TABLET | Freq: Every day | ORAL | 0 refills | Status: AC

## 2018-12-12 MED ORDER — HYDROXYZINE HCL 25 MG PO TABS: 25.0000 mg | ORAL_TABLET | Freq: Three times a day (TID) | ORAL | 0 refills | Status: AC | PRN

## 2018-12-12 MED ORDER — BUSPIRONE HCL 5 MG PO TABS: 5.0000 mg | ORAL_TABLET | Freq: Three times a day (TID) | ORAL | 0 refills | Status: AC

## 2018-12-12 MED ORDER — CITALOPRAM HYDROBROMIDE 20 MG PO TABS: 20.0000 mg | ORAL_TABLET | Freq: Every day | ORAL | 0 refills | Status: AC
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
## 2018-12-12 NOTE — Therapy (Signed)
[place], [place], [address] Phone: [phone]   Fax:  [phone]  Occupational Therapy Treatment  Patient Details  Name: Melanie Riley MRN: [id number] Date of Birth: 1955-05-04 Referring Provider (OT): Ricky Ala, NP  Virtual Visit via Video Note  I connected with Melanie Riley on 12/12/18 at  8:00 AM EST by a video enabled telemedicine application and verified that I am speaking with the correct person using two identifiers.   I discussed the limitations of evaluation and management by telemedicine and the availability of in person appointments. The patient expressed understanding and agreed to proceed.    I discussed the assessment and treatment plan with the patient. The patient was provided an opportunity to ask questions and all were answered. The patient agreed with the plan and demonstrated an understanding of the instructions.   The patient was advised to call back or seek an in-person evaluation if the symptoms worsen or if the condition fails to improve as anticipated.  I provided 60 minutes of non-face-to-face time during this encounter.   Zenovia Jarred, OT    Encounter Date: 12/12/2018  OT End of Session - 12/12/18 1732    Visit Number  4    Number of Visits  12    Date for OT Re-Evaluation  01/06/19    Authorization Type  [employer]    OT Start Time  1200    OT Stop Time  1300    OT Time Calculation (min)  60 min       Past Medical History:  Diagnosis Date  . Anxiety   . Depression   . Hypertension     Past Surgical History:  Procedure Laterality Date  . ABDOMINAL HYSTERECTOMY      There were no vitals filed for this visit.  Subjective Assessment - 12/12/18 1732    Currently in Pain?  No/denies           S: I need to improve my social self care   O: Education given on self care and its importance in regular BADL/IADL routine. Pt completed self  care assessment to identify areas of strength and weakness. Self care assessments covered areas of physical health, psychological health, spiritual health, and professional health. Pt asked to identifies area of weakness within each area and develop plans for improvement this date. Pt encouraged to brainstorm with other peers to begin goal setting in areas of desired change.   A: Pt presents with flat affect, engaged and participatory throughout session. She shares how social self care is her lowest. As well as her appetite  (physical self care). She wants to increase her social self care by reaching out to an old friend this weekend.  P: OT group will be x3 per week while pt PHP             OT Education - 12/12/18 1732    Education Details  education given on self care    Person(s) Educated  Patient    Methods  Explanation;Handout    Comprehension  Verbalized understanding;Returned demonstration       OT Short Term Goals - 12/12/18 1732      OT SHORT TERM GOAL #1   Title  Pt will be educated on strategies to improve psychosocial skills needed to participate fully in all daily, work, and leisure activities    Time  4    Period  Weeks    Status  Achieved    Target Date  01/06/19      OT SHORT TERM GOAL #2   Title  Pt will apply psychosocial skills and coping mechanisms to daily activities in order to function independently and reintegrate into community    Time  4    Period  Weeks    Status  Achieved      OT SHORT TERM GOAL #3   Title  Pt will engage in goal setting to improve functional BADL/IADL routine upon reintegrating into community.    Time  4    Period  Weeks    Status  Partially Met      OT SHORT TERM GOAL #4   Title  Pt will recall and/or apply 1-3 sleep hygiene strategies to improve function in BADL routine upon reintegrating into community    Time  4    Period  Weeks    Status  Partially Met               Plan - 12/12/18 1732    Occupational  performance deficits (Please refer to evaluation for details):  ADL's;IADL's;Rest and Sleep;Work;Leisure;Social Participation    Body Structure / Function / Physical Skills  ADL;IADL    Cognitive Skills  Energy/Drive    Psychosocial Skills  Coping Strategies;Habits;Interpersonal Interaction;Routines and Behaviors       Patient will benefit from skilled therapeutic intervention in order to improve the following deficits and impairments:   Body Structure / Function / Physical Skills: ADL, IADL Cognitive Skills: Energy/Drive Psychosocial Skills: Coping Strategies, Habits, Interpersonal Interaction, Routines and Behaviors   Visit Diagnosis: Severe episode of recurrent major depressive disorder, without psychotic features ([place])  Difficulty coping    Problem List Patient Active Problem List   Diagnosis Date Noted  . Major depressive disorder, recurrent, severe with psychotic features ([place]) 11/20/2018   OCCUPATIONAL THERAPY DISCHARGE SUMMARY  Visits from Start of Care: 4  Current functional level related to goals / functional outcomes: Continue to implement psychosocial skills learned for improved community integration   Remaining deficits: Coping skills, social self care   Education / Equipment: Education given on psychosocial and coping mechanissms as it pertains to BADL/IADL function and reintegrating into community Plan: Patient agrees to discharge.  Patient goals were partially met. Patient is being discharged due to being pleased with the current functional level.  ?????        Zenovia Jarred, MSOT, OTR/L Behavioral Health OT/ Acute Relief OT PHP Office: [place] 12/12/2018, 5:33 PM  [facility] PARTIAL HOSPITALIZATION PROGRAM [place], [place], [address] Phone: [phone]   Fax:  [phone]  Name: Melanie Riley MRN: [id number] Date of Birth: 1955-11-26

## 2018-12-12 NOTE — Therapy (Signed)
[place], [place], [address] Phone: [phone]   Fax:  [phone]  Occupational Therapy Treatment  Patient Details  Name: Melanie Riley MRN: [id number] Date of Birth: [birth year] Referring Provider (OT): Ricky Ala, NP  Virtual Visit via Video Note  I connected with Melanie Riley on 12/12/18 at  8:00 AM EST by a video enabled telemedicine application and verified that I am speaking with the correct person using two identifiers.   I discussed the limitations of evaluation and management by telemedicine and the availability of in person appointments. The patient expressed understanding and agreed to proceed.    I discussed the assessment and treatment plan with the patient. The patient was provided an opportunity to ask questions and all were answered. The patient agreed with the plan and demonstrated an understanding of the instructions.   The patient was advised to call back or seek an in-person evaluation if the symptoms worsen or if the condition fails to improve as anticipated.  I provided 60 minutes of non-face-to-face time during this encounter.   Zenovia Jarred, OT    Encounter Date: 12/11/2018  OT End of Session - 12/12/18 1720    Visit Number  3    Number of Visits  12    Date for OT Re-Evaluation  01/06/19    Authorization Type  [employer] M[employer]    OT Start Time  1200    OT Stop Time  1300    OT Time Calculation (min)  60 min    Activity Tolerance  Patient tolerated treatment well    Behavior During Therapy  WFL for tasks assessed/performed       Past Medical History:  Diagnosis Date  . Anxiety   . Depression   . Hypertension     Past Surgical History:  Procedure Laterality Date  . ABDOMINAL HYSTERECTOMY      There were no vitals filed for this visit.  Subjective Assessment - 12/12/18 1720    Currently in Pain?  No/denies         S: These skills are all  helpful  O: Continued education given on stress management from previous session   A: Pt presents with flat affect, engaged and participatory. She shares again how she suppresses her stress until the point of acting out. She shares that she would like to implement the skill of acceptance and learning how to cope with things she can vs cannot control  P: OT group will be x3 per week while pt in PHP               OT Education - 12/12/18 1720    Education Details  cont education given on stress management    Person(s) Educated  Patient    Methods  Explanation;Handout    Comprehension  Verbalized understanding;Returned demonstration       OT Short Term Goals - 12/10/18 1517      OT SHORT TERM GOAL #1   Title  Pt will be educated on strategies to improve psychosocial skills needed to participate fully in all daily, work, and leisure activities    Time  4    Period  Weeks    Status  On-going    Target Date  01/06/19      OT [place] #2   Title  Pt will apply psychosocial skills and coping mechanisms to daily activities in order to function independently and reintegrate into community  Time  4    Period  Weeks    Status  On-going      OT SHORT TERM GOAL #3   Title  Pt will engage in goal setting to improve functional BADL/IADL routine upon reintegrating into community.    Time  4    Period  Weeks    Status  On-going      OT SHORT TERM GOAL #4   Title  Pt will recall and/or apply 1-3 sleep hygiene strategies to improve function in BADL routine upon reintegrating into community    Time  4    Period  Weeks    Status  On-going               Plan - 12/12/18 1720    Occupational performance deficits (Please refer to evaluation for details):  ADL's;IADL's;Rest and Sleep;Work;Leisure;Social Participation    Body Structure / Function / Physical Skills  ADL;IADL    Cognitive Skills  Energy/Drive    Psychosocial Skills  Coping  Strategies;Habits;Interpersonal Interaction;Routines and Behaviors       Patient will benefit from skilled therapeutic intervention in order to improve the following deficits and impairments:   Body Structure / Function / Physical Skills: ADL, IADL Cognitive Skills: Energy/Drive Psychosocial Skills: Coping Strategies, Habits, Interpersonal Interaction, Routines and Behaviors   Visit Diagnosis: Severe episode of recurrent major depressive disorder, without psychotic features ([facility])  Difficulty coping    Problem List Patient Active Problem List   Diagnosis Date Noted  . Major depressive disorder, recurrent, severe with psychotic features ([facility]) 11/20/2018   Dalphine Handing, MSOT, OTR/L Behavioral Health OT/ Acute Relief OT PHP Office: [phone]  Dalphine Handing 12/12/2018, 5:21 PM  [facility] PARTIAL HOSPITALIZATION PROGRAM [address] [place], [place], [address] Phone: [phone]   Fax:  [phone]  Name: Melanie Riley MRN: [id number] Date of Birth: June 09, 1955

## 2018-12-15 ENCOUNTER — Other Ambulatory Visit (HOSPITAL_COMMUNITY): Payer: Medicare HMO | Admitting: Licensed Clinical Social Worker

## 2018-12-15 ENCOUNTER — Other Ambulatory Visit (HOSPITAL_COMMUNITY): Payer: Medicare HMO

## 2018-12-15 ENCOUNTER — Other Ambulatory Visit: Payer: Self-pay

## 2018-12-15 DIAGNOSIS — F332 Major depressive disorder, recurrent severe without psychotic features: Secondary | ICD-10-CM | POA: Diagnosis not present

## 2018-12-16 ENCOUNTER — Other Ambulatory Visit: Payer: Self-pay

## 2018-12-16 ENCOUNTER — Other Ambulatory Visit (HOSPITAL_COMMUNITY): Payer: Medicare HMO | Admitting: Licensed Clinical Social Worker

## 2018-12-16 ENCOUNTER — Ambulatory Visit (HOSPITAL_COMMUNITY): Payer: Medicare HMO

## 2018-12-16 ENCOUNTER — Other Ambulatory Visit (HOSPITAL_COMMUNITY): Payer: Medicare HMO

## 2018-12-16 DIAGNOSIS — F332 Major depressive disorder, recurrent severe without psychotic features: Secondary | ICD-10-CM

## 2018-12-16 DIAGNOSIS — R4589 Other symptoms and signs involving emotional state: Secondary | ICD-10-CM

## 2018-12-16 NOTE — Progress Notes (Signed)
Spoke with patient via Webex video call, used 2 identifiers to correctly identify patient. She is being discharged tomorrow from [facility] and will be seeing a therapist. She is calling today at [facility] services to see about getting a Psychiatrist to continue her medications. Trazodone and Vistaril work best when taken together at night but she continues to have trouble falling asleep and wakes frequently. She would also like something different for anxiety. On scale 1-10 as 10 being worst she rates depression at 6 and anxiety at 7/8. Denies SI/HI or AV hallucinations. PHQ9=25. She still has a lot of guilt for what she did and wonders why she wakes up everyday. She had a hard time with PHP groups in the beginning but "got a lot more out of it" after the first week. No side effects from medication. No issues or complaints.

## 2018-12-17 ENCOUNTER — Other Ambulatory Visit: Payer: Self-pay

## 2018-12-17 ENCOUNTER — Encounter (HOSPITAL_COMMUNITY): Payer: Self-pay | Admitting: Family

## 2018-12-17 ENCOUNTER — Other Ambulatory Visit (HOSPITAL_COMMUNITY): Payer: Medicare HMO

## 2018-12-17 ENCOUNTER — Other Ambulatory Visit (HOSPITAL_COMMUNITY): Payer: Medicare HMO | Admitting: Licensed Clinical Social Worker

## 2018-12-17 DIAGNOSIS — F332 Major depressive disorder, recurrent severe without psychotic features: Secondary | ICD-10-CM

## 2018-12-17 NOTE — Progress Notes (Signed)
Spiritual care group 12/17/2018 11:00 - 12:20 ? Group met via web-ex due to COVID-19 precautions.  Group facilitated by Simone Curia, MDiv, BCC  ? ? Group focused on topic of strength. ?Group members reflected on what thoughts and feelings emerge when they hear this topic. ?They then engaged in facilitated dialog around how strength is present in their lives. This dialog focused on representing what strength had been to them in their lives (images and patterns given) and what they saw as helpful in their life now (what they needed / wanted). ? ? Activity drew on narrative framework   Melanie Riley was present throughout group.  Spoke with group members about her daughter's biopsy today.  In conversation, she identified strength as "having perspective"  She went on to describe being able to find acceptance for things outside of her control and invest her energy in worthy places.  She resonated with other group members around setting boundaries and "always feeling like I had to take care of others."

## 2018-12-17 NOTE — Progress Notes (Signed)
Virtual Visit via Video Note  I connected with Melanie Riley on 12/17/18 at  9:00 AM EST by a video enabled telemedicine application and verified that I am speaking with the correct person using two identifiers.   I discussed the limitations of evaluation and management by telemedicine and the availability of in person appointments. The patient expressed understanding and agreed to proceed.    I discussed the assessment and treatment plan with the patient. The patient was provided an opportunity to ask questions and all were answered. The patient agreed with the plan and demonstrated an understanding of the instructions.   The patient was advised to call back or seek an in-person evaluation if the symptoms worsen or if the condition fails to improve as anticipated.  I provided  15 minutes of non-face-to-face time during this encounter.   Derrill Center, NP   BH MD/PA/NP OP Progress Note  12/17/2018 7:38 AM Melanie Riley  MRN:  [id number]   Evaluation: Melanie Riley was evaluated via WebEx.  She presents with a brighter affect during this assessment.  She is denying suicidal or homicidal ideations.  Denies auditory or visual hallucinations.  Patient reported she is starting to enjoy group a little better. Melanie Riley continues to be guarded regarding stressors. As per the treatment team, was reported that patient appears to be opening up a little bit more.  Patient scheduled to discharge on 12/19/2018.  Reports plans to follow-up with individual therapy on 12/20/2018.  Will provide medication refills.  Reported overall feeling less stressed.  Reports mild insomnia however has improved with medications.  Patient to continue group programming.  Support, encouragement and reassurance was provided.  Visit Diagnosis: No diagnosis found.  Past Psychiatric History:   Past Medical History:  Past Medical History:  Diagnosis Date  . Anxiety   . Depression   . Hypertension     Past Surgical  History:  Procedure Laterality Date  . ABDOMINAL HYSTERECTOMY      Family Psychiatric History:   Family History:  Family History  Problem Relation Age of Onset  . Alcohol abuse Sister   . Drug abuse Sister   . Alcohol abuse Brother   . Drug abuse Brother     Social History:  Social History   Socioeconomic History  . Marital status: Married    Spouse name: Not on file  . Number of children: 3  . Years of education: Not on file  . Highest education level: Not on file  Occupational History  . Not on file  Tobacco Use  . Smoking status: Never Smoker  . Smokeless tobacco: Never Used  Substance and Sexual Activity  . Alcohol use: Not Currently  . Drug use: Never  . Sexual activity: Not Currently    Comment: Hx of histerectomy  Other Topics Concern  . Not on file  Social History Narrative  . Not on file   Social Determinants of Health   Financial Resource Strain: High Risk  . Difficulty of Paying Living Expenses: Very hard  Food Insecurity: No Food Insecurity  . Worried About Charity fundraiser in the Last Year: Never true  . Ran Out of Food in the Last Year: Never true  Transportation Needs: No Transportation Needs  . Lack of Transportation (Medical): No  . Lack of Transportation (Non-Medical): No  Physical Activity: Unknown  . Days of Exercise per Week: 0 days  . Minutes of Exercise per Session: Not on file  Stress: Stress Concern Present  .  Feeling of Stress : Very much  Social Connections: Somewhat Isolated  . Frequency of Communication with Friends and Family: Twice a week  . Frequency of Social Gatherings with Friends and Family: Never  . Attends Religious Services: 1 to 4 times per year  . Active Member of Clubs or Organizations: No  . Attends BankerClub or Organization Meetings: Never  . Marital Status: Married    Allergies: No Known Allergies  Metabolic Disorder Labs: Lab Results  Component Value Date   HGBA1C 6.0 (H) 11/21/2018   MPG 125.5  11/21/2018   No results found for: PROLACTIN Lab Results  Component Value Date   CHOL 193 11/21/2018   TRIG 144 11/21/2018   HDL 33 (L) 11/21/2018   CHOLHDL 5.8 11/21/2018   VLDL 29 11/21/2018   LDLCALC 131 (H) 11/21/2018   Lab Results  Component Value Date   TSH 3.557 11/21/2018    Therapeutic Level Labs: No results found for: LITHIUM No results found for: VALPROATE No components found for:  CBMZ  Current Medications: Current Outpatient Medications  Medication Sig Dispense Refill  . busPIRone (BUSPAR) 5 MG tablet Take 1 tablet (5 mg total) by mouth 3 (three) times daily. For anxiety 90 tablet 0  . citalopram (CELEXA) 20 MG tablet Take 1 tablet (20 mg total) by mouth daily. For depression 30 tablet 0  . hydrOXYzine (ATARAX/VISTARIL) 25 MG tablet Take 1 tablet (25 mg total) by mouth 3 (three) times daily as needed for anxiety. 75 tablet 0  . lisinopril (ZESTRIL) 5 MG tablet Take 1 tablet (5 mg total) by mouth daily. For high blood pressure 30 tablet 0  . polyethylene glycol (MIRALAX / GLYCOLAX) 17 g packet Take 17 g by mouth daily as needed. (May buy from over the counter): Constipation 14 each 0  . traZODone (DESYREL) 100 MG tablet Take 1 tablet (100 mg total) by mouth at bedtime. For sleep 30 tablet 0   No current facility-administered medications for this visit.     Musculoskeletal: Strength & Muscle Tone: within normal limits Gait & Station: normal Patient leans: N/A  Psychiatric Specialty Exam: Review of Systems  There were no vitals taken for this visit.There is no height or weight on file to calculate BMI.  General Appearance: Casual  Eye Contact:  Fair  Speech:  Clear and Coherent  Volume:  Normal  Mood:  Anxious  Affect:  Congruent  Thought Process:  Coherent  Orientation:  Full (Time, Place, and Person)  Thought Content: Logical   Suicidal Thoughts:  No  Homicidal Thoughts:  No  Memory:  Immediate;   Fair Recent;   Fair  Judgement:  Fair  Insight:   Fair  Psychomotor Activity:  Normal  Concentration:  Concentration: Good  Recall:  Fair  Fund of Knowledge: Fair  Language: Fair  Akathisia:  No  Handed:  Right  AIMS (if indicated):   Assets:  Communication Skills Desire for Improvement Physical Health Social Support  ADL's:  Intact  Cognition: WNL  Sleep:  Fair   Screenings: AIMS     [facility]  AIMS Total Score  0    AUDIT     [facility]  Alcohol Use Disorder Identification Test Final Score (AUDIT)  0    PHQ2-9     Counselor from 12/16/2018 [facility] Counselor from 12/05/2018 in [facility]  PHQ-2 Total Score  6  6  PHQ-9 Total Score  25  25      Assessment and Plan:  Continue partial hospitalization programming (PHP) -Plans to transition to individual therapy on 12/19/2018 -Medications refilled  Treatment plan was reviewed and agreed upon by NP T. Melvyn Neth and patient Svea Pusch" Cannon's need for continued group services   Oneta Rack, NP 12/17/2018, 7:38 AM

## 2018-12-18 ENCOUNTER — Ambulatory Visit (HOSPITAL_COMMUNITY): Payer: Medicare HMO

## 2018-12-18 ENCOUNTER — Other Ambulatory Visit (HOSPITAL_COMMUNITY): Payer: Medicare HMO

## 2018-12-18 ENCOUNTER — Other Ambulatory Visit: Payer: Self-pay

## 2018-12-18 ENCOUNTER — Ambulatory Visit (INDEPENDENT_AMBULATORY_CARE_PROVIDER_SITE_OTHER): Payer: Medicare HMO | Admitting: Licensed Clinical Social Worker

## 2018-12-18 DIAGNOSIS — F332 Major depressive disorder, recurrent severe without psychotic features: Secondary | ICD-10-CM

## 2018-12-18 DIAGNOSIS — R69 Illness, unspecified: Secondary | ICD-10-CM | POA: Diagnosis not present

## 2018-12-18 NOTE — Psych (Signed)
Virtual Visit via Video Note  I connected with Melanie Riley on 12/05/18 at  9:00 AM EST by a video enabled telemedicine application and verified that I am speaking with the correct person using two identifiers.   I discussed the limitations of evaluation and management by telemedicine and the availability of in person appointments. The patient expressed understanding and agreed to proceed.  I discussed the assessment and treatment plan with the patient. The patient was provided an opportunity to ask questions and all were answered. The patient agreed with the plan and demonstrated an understanding of the instructions.   The patient was advised to call back or seek an in-person evaluation if the symptoms worsen or if the condition fails to improve as anticipated.  Pt was provided 240 minutes of non-face-to-face time during this encounter.   Lorin Glass, LCSW    [facility] PHP THERAPIST PROGRESS NOTE  Melanie Riley [id number]  Session Time: 9:00 - 10:00  Participation Level: Active  Behavioral Response: CasualAlertAnxious and Depressed  Type of Therapy: Group Therapy  Treatment Goals addressed: Coping  Interventions: CBT, DBT, Supportive and Reframing  Summary: Clinician led check-in regarding current stressors and situation, and review of patient completed daily inventory. Clinician utilized active listening and empathetic response and validated patient emotions. Clinician facilitated processing group on pertinent issues.   Therapist Response:Melanie Riley is a 63 y.o. female who presents with depression symptoms.  Patient arrived within time allowed and reports that she is feeling "okay." Patient rates hermood at [facility] a scale of 1-10 with 10 being great. Pt reports she waking up shaky again and that it subsided after taking her morning medication. Pt states she spent the afternoon cleaning and watching tv. Pt states she saw her granddaughter over video chat and  that was good. Pt reports struggling with rumination and self-forgiveness. Pt able to process. Patient engaged in discussion.      Session Time: 10:00-11:00  Participation Level:Active  Behavioral Response:CasualAlertDepressed  Type of Therapy: Group Therapy, psychoeducation, psychotherapy  Treatment Goals addressed: Coping  Interventions:CBT, DBT, Solution Focused, Supportive and Reframing  Summary:Cln led discussion on honesty and how to increase awareness of whether we are being honest with ourselves. Group members discussed barriers to being honest and how protecting yourself sometimes includes levels of dishonesty. Group shared experiences in which this has been an issue for them.    Therapist Response: Pt engaged in discussion and provided support and feedback to group members.        Session Time: 11:00- 12:00  Participation Level:Active  Behavioral Response:CasualAlertDepressed  Type of Therapy: Group Therapy, psychoeducation, psychotherapy  Treatment Goals addressed: Coping  Interventions:CBT, DBT, Solution Focused, Supportive and Reframing  Summary:Cln led discussion on mending conflict in relationships. Group members discussed ways in which they have handled conflict in the past and determined ways to handle it in the future such as giving time and space, writing a letter, and easing into it.   Therapist Response: Pt engaged in discussion and provided support and feedback to group members. Pt reports she thinks she needs more time before mending her current conflicts and is open to trying a letter when she is ready.       Session Time: 12:00 -1:00  Participation Level:Active  Behavioral Response:CasualAlertDepressed  Type of Therapy: Group Therapy, Psychoeducation; Psychotherapy  Treatment Goals addressed: Coping  Interventions:CBT; Solution focused; Supportive; Reframing  Summary:12:00 - 12:50Cln  introduced the DBT TIP skill. Group discussed the 4 skills involved and how  to apply  them into their lives . 12:50 -1:00 Clinician led check-out. Clinician assessed for immediate needs, medication compliance and efficacy, and safety concerns  Therapist Response:12:00 - 12:50Pt engaged in discussion and reports understanding of skills. Pt identifies paced breathing as the skill most likely to utilize.  12:50 - 1:00: At check-out, patientrates hermood at a 5 on a scale of 1-10 with 10 being great. Pt states afternoon plans of cooking and watching tv. Patient demonstrates someprogress as evidenced byincreased interaction with group members.Patient denies SI/HI/self-harm at the end of group.    Suicidal/Homicidal: Nowithout intent/plan  Plan: Pt will continue in PHP while working to decrease depression symptoms, increase hopefulness, and increase ability to manage symptoms in a healthy manner.   Diagnosis: Severe episode of recurrent major depressive disorder, without psychotic features (HCC) [F33.2]    1. Severe episode of recurrent major depressive disorder, without psychotic features (HCC)       [name], LCSW 12/18/2018

## 2018-12-18 NOTE — Psych (Signed)
Virtual Visit via Video Note  I connected with Melanie Riley on 12/08/18 at  9:00 AM EST by a video enabled telemedicine application and verified that I am speaking with the correct person using two identifiers.   I discussed the limitations of evaluation and management by telemedicine and the availability of in person appointments. The patient expressed understanding and agreed to proceed.  I discussed the assessment and treatment plan with the patient. The patient was provided an opportunity to ask questions and all were answered. The patient agreed with the plan and demonstrated an understanding of the instructions.   The patient was advised to call back or seek an in-person evaluation if the symptoms worsen or if the condition fails to improve as anticipated.  Pt was provided 240 minutes of non-face-to-face time during this encounter.   [name], LCSW    [facility] PHP THERAPIST PROGRESS NOTE  Melanie Riley [id number]  Session Time: 9:00 - 10:00  Participation Level: Active  Behavioral Response: CasualAlertAnxious and Depressed  Type of Therapy: Group Therapy  Treatment Goals addressed: Coping  Interventions: CBT, DBT, Supportive and Reframing  Summary: Clinician led check-in regarding current stressors and situation, and review of patient completed daily inventory. Clinician utilized active listening and empathetic response and validated patient emotions. Clinician facilitated processing group on pertinent issues.   Therapist Response:Melanie Riley is a 63 y.o. female who presents with depression symptoms.  Patient arrived within time allowed and reports that she is feeling "the same." Patient rates hermood at [facility] a scale of 1-10 with 10 being great. Pt reports her weekend was "okay" and her grandson spent Saturday with her. Pt states she continues to sleep poorly and feel like a burden. Pt able to process. Patient engaged in  discussion.      Session Time: 10:00-11:00  Participation Level:Active  Behavioral Response:CasualAlertDepressed  Type of Therapy: Group Therapy, psychoeducation, psychotherapy  Treatment Goals addressed: Coping  Interventions:CBT, DBT, Solution Focused, Supportive and Reframing  Summary:Cln led discussion on anxiety about returning to work. Cln normalized feelings and facilitated connection between group members. Group members synthesized previous topics of distraction, boundaries, and cognitive distortions to brainstorm ways to manage the worry and improve likely outcomes when returning to work.   Therapist Response: Pt reports returning to work is not in her plans however connects with anxiety regarding a future event. Pt is able to relate to and offer support to other group members.        Session Time: 11:00- 12:00  Participation Level:Active  Behavioral Response:CasualAlertDepressed  Type of Therapy: Group Therapy, psychoeducation, psychotherapy  Treatment Goals addressed: Coping  Interventions:CBT, DBT, Solution Focused, Supportive and Reframing  Summary:Cln introduced topic of Self-Soothe skills. Cln reviewed the DBT skill and how to apply it as a way to reduce emotional reactivity. Group members discussed ways they can utilize the skill in their own lives.   Therapist Response: Pt reports understanding of self soothe skill and identifies petting her dogs, watching outside the window as ways she can practice the skill.        Session Time: 12:00 -1:00  Participation Level:Active  Behavioral Response:CasualAlertDepressed  Type of Therapy: Group Therapy, Psychoeducation; Psychotherapy  Treatment Goals addressed: Coping  Interventions:CBT; Solution focused; Supportive; Reframing  Summary:12:00 - 12:50Cln introduced "I Statements" and how to increase positive interpersonal communication by utilizing the  "I statement" structure. Cln utilized "I Statements" handout to review the format and group practiced examples to increase comfort in using the format.  12:50 -1:00 Clinician led check-out. Clinician assessed for immediate needs, medication compliance and efficacy, and safety concerns  Therapist Response:12:00 - 12:50Pt reports understanding of "I statements" format and how to utilize them.  12:50 - 1:00: At check-out, patientrates hermood at a 6 on a scale of 1-10 with 10 being great. Pt states afternoon plans of cooking dinner and watching tv. Patient demonstrates someprogress as evidenced by exhibiting astronger voice today.Patient denies SI/HI/self-harm at the end of group.    Suicidal/Homicidal: Nowithout intent/plan  Plan: Pt will continue in PHP while working to decrease depression symptoms, increase hopefulness, and increase ability to manage symptoms in a healthy manner.   Diagnosis: Severe episode of recurrent major depressive disorder, without psychotic features ([place]) [F33.2]    1. Severe episode of recurrent major depressive disorder, without psychotic features ([place])       Lorin Glass, LCSW 12/18/2018
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
## 2018-12-18 NOTE — Psych (Signed)
Virtual Visit via Video Note  I connected with Melanie Riley on 12/09/18 at  9:00 AM EST by a video enabled telemedicine application and verified that I am speaking with the correct person using two identifiers.   I discussed the limitations of evaluation and management by telemedicine and the availability of in person appointments. The patient expressed understanding and agreed to proceed.  I discussed the assessment and treatment plan with the patient. The patient was provided an opportunity to ask questions and all were answered. The patient agreed with the plan and demonstrated an understanding of the instructions.   The patient was advised to call back or seek an in-person evaluation if the symptoms worsen or if the condition fails to improve as anticipated.  Pt was provided 240 minutes of non-face-to-face time during this encounter.   [name], LCSW    [facility] PHP THERAPIST PROGRESS NOTE  Melanie Riley [id number]  Session Time: 9:00 - 10:00  Participation Level: Active  Behavioral Response: CasualAlertAnxious and Depressed  Type of Therapy: Group Therapy  Treatment Goals addressed: Coping  Interventions: CBT, DBT, Supportive and Reframing  Summary: Clinician led check-in regarding current stressors and situation, and review of patient completed daily inventory. Clinician utilized active listening and empathetic response and validated patient emotions. Clinician facilitated processing group on pertinent issues.   Therapist Response:Melanie Riley is a 63 y.o. female who presents with depression symptoms.  Patient arrived within time allowed and reports that she is feeling "like I'm in a rut." Patient rates hermood at [facility] a scale of 1-10 with 10 being great. Pt reports she is "jittery" about leaving the house and also tired of being at home. Pt states she is trying to have healthier thinking however is struggling. Pt able to process. Patient engaged in  discussion.       Session Time: 10:00 -11:00  Participation Level: Active  Behavioral Response: CasualAlertDepressed  Type of Therapy: Group Therapy, psychoeducation, psychotherapy  Treatment Goals addressed: Coping  Interventions: CBT, DBT, Solution Focused, Supportive and Reframing  Summary: Cln led discussion on living in-line with values. Group discussed identification, being wary of small divergences, and increased recognition when actions shift away from values. Group members shared ways they have deviated from their values and how is a struggle for them to rectify.   Therapist Response: Pt engaged in discussion and report she feels she has diverged very far from her values. Pt declines to share specifics of her concerns and states she feels hopeless regarding this drift.      Session Time: 11:00- 12:00  Participation Level: Active  Behavioral Response: CasualAlertDepressed  Type of Therapy: Group Therapy, Psychoeducation; Psychotherapy  Treatment Goals addressed: Coping  Interventions: CBT; Solution focused; Supportive; Reframing  Summary: Cln introduced topic of "fear setting" and group viewed TED talk "Why you should define your fears instead of your goals." Cln discussed fear setting as a decision making model that can aid in making decisions we are struggling with by looking at multiple sides and options to address them. Group used example to work through the model.   Therapist Response: Pt engaged in discussion and reports understanding of the fear setting model.       Session Time: 12:00 -1:00  Participation Level:Active  Behavioral Response:CasualAlertDepressed  Type of Therapy: Group Therapy, OT  Treatment Goals addressed: Coping  Interventions:Psychosocial skills training, Supportive,   Summary:12:00 - 12:50:Occupational Therapy group 12:50 -1:00 Clinician led check-out. Clinician assessed for immediate needs,  medication compliance  and efficacy, and safety concerns   Therapist Response:12:00 - 12:50:Patient engaged in group. See OT note.  12:50 - 1:00: At check-out, patientrates hermood at a 6 on a scale of 1-10 with 10 being great. Pt states afternoon plans of going to the grocery store and spending time with her dogs. Patient demonstrates someprogress as evidenced byincreased sharing.Patient denies SI/HI/self-harm at the end of group.    Suicidal/Homicidal: Nowithout intent/plan  Plan: Pt will continue in PHP while working to decrease depression symptoms, increase hopefulness, and increase ability to manage symptoms in a healthy manner.   Diagnosis: Severe episode of recurrent major depressive disorder, without psychotic features ([place]) [F33.2]    1. Severe episode of recurrent major depressive disorder, without psychotic features ([place])       Lorin Glass, LCSW 12/18/2018

## 2018-12-18 NOTE — Psych (Signed)
Virtual Visit via Video Note  I connected with Melanie Riley on 12/03/18 at  9:00 AM EST by a video enabled telemedicine application and verified that I am speaking with the correct person using two identifiers.   I discussed the limitations of evaluation and management by telemedicine and the availability of in person appointments. The patient expressed understanding and agreed to proceed.  I discussed the assessment and treatment plan with the patient. The patient was provided an opportunity to ask questions and all were answered. The patient agreed with the plan and demonstrated an understanding of the instructions.   The patient was advised to call back or seek an in-person evaluation if the symptoms worsen or if the condition fails to improve as anticipated.  Pt was provided 240 minutes of non-face-to-face time during this encounter.   [name], LCSW    [facility] PHP THERAPIST PROGRESS NOTE  Melanie Riley [id number]  Session Time: 9:00 - 10:00  Participation Level: Active  Behavioral Response: CasualAlertAnxious and Depressed  Type of Therapy: Group Therapy  Treatment Goals addressed: Coping  Interventions: CBT, DBT, Supportive and Reframing  Summary: Clinician led check-in regarding current stressors and situation, and review of patient completed daily inventory. Clinician utilized active listening and empathetic response and validated patient emotions. Clinician facilitated processing group on pertinent issues.   Therapist Response:Melanie Riley is a 63 y.o. female who presents with depression symptoms.  Patient arrived within time allowed and reports that she is feeling "good." Patient rates hermood at [facility] a scale of 1-10 with 10 being great. Pt reports she spent yesterday watching tv and making dinner. Pt reports she does not feel as "shaky" this morning as she has been. Pt reports sleep is a struggle for her and that she is havig hot/cold sweats and  nightmares.  Pt states she has been isolating herself due to fears of others judgment after she "ruined my life." Pt is guarded about details of what started her mental health decline. Pt able to process. Patient engaged in discussion.       Session Time: 10:00-11:00  Participation Level:Active  Behavioral Response:CasualAlertDepressed  Type of Therapy: Group Therapy, psychoeducation, psychotherapy  Treatment Goals addressed: Coping  Interventions:CBT, DBT, Solution Focused, Supportive and Reframing  Summary:Cln led discussion on breaking habits. Group members discussed habits they are working to break and barriers they encounter in doing so. Cln suggested substitution and hindisght recognition as two strategies to try and group brainstormed how to do so.   Therapist Response: Pt engaged in discussion and identifies habit of putting herself last as one she wants to change. Pt able to determine action plan.        Session Time: 11:00- 12:00  Participation Level: Active  Behavioral Response: CasualAlertDepressed  Type of Therapy: Group Therapy, psychotherapy  Treatment Goals addressed: Coping  Interventions: Strengths based, reframing, Supportive,   Summary:  Spiritual Care group  Therapist Response: Patient engaged in group. See chaplain note.          Session Time: 12:00 -1:00  Participation Level: Active  Behavioral Response: CasualAlertDepressed  Type of Therapy: Group Therapy, Psychoeducation  Treatment Goals addressed: Coping  Interventions: relaxation training; Supportive; Reframing  Summary: 12:00 - 12:50: Relaxation group: Cln led group focused on retraining the body's response to stress.   12:50 -1:00 Clinician led check-out. Clinician assessed for immediate needs, medication compliance and efficacy, and safety concerns   Therapist Response:12:00 - 12:50:Patient engaged in activity and discussion   12:50 -  1:00: At check-out, patientrates hermood at a 5.5 on a scale of 1-10 with 10 being great. Pt states afternoon plans of resting, and doing laundry. Patient demonstrates someprogress as evidenced byparticipating in first group session.Patient denies SI/HI/self-harm at the end of group.    Suicidal/Homicidal: Nowithout intent/plan  Plan: Pt will continue in PHP while working to decrease depression symptoms, increase hopefulness, and increase ability to manage symptoms in a healthy manner.   Diagnosis: Major depressive disorder, recurrent, severe with psychotic features ([place]) [F33.3]    1. Major depressive disorder, recurrent, severe with psychotic features ([place])       Lorin Glass, LCSW 12/18/2018

## 2018-12-18 NOTE — Psych (Signed)
Virtual Visit via Video Note  I connected with Melanie Riley on 12/04/18 at  9:00 AM EST by a video enabled telemedicine application and verified that I am speaking with the correct person using two identifiers.   I discussed the limitations of evaluation and management by telemedicine and the availability of in person appointments. The patient expressed understanding and agreed to proceed.  I discussed the assessment and treatment plan with the patient. The patient was provided an opportunity to ask questions and all were answered. The patient agreed with the plan and demonstrated an understanding of the instructions.   The patient was advised to call back or seek an in-person evaluation if the symptoms worsen or if the condition fails to improve as anticipated.  Pt was provided 240 minutes of non-face-to-face time during this encounter.   Lorin Glass, LCSW    [facility] [place] PHP THERAPIST PROGRESS NOTE  Melanie Riley [id number]  Session Time: 9:00 - 10:00  Participation Level: Active  Behavioral Response: CasualAlertAnxious and Depressed  Type of Therapy: Group Therapy  Treatment Goals addressed: Coping  Interventions: CBT, DBT, Supportive and Reframing  Summary: Clinician led check-in regarding current stressors and situation, and review of patient completed daily inventory. Clinician utilized active listening and empathetic response and validated patient emotions. Clinician facilitated processing group on pertinent issues.   Therapist Response:Favor D Ferrebee is a 63 y.o. female who presents with depression symptoms.  Patient arrived within time allowed and reports that she is feeling "heavy." Patient rates hermood at [facility] a scale of 1-10 with 10 being great. Pt reports she "dreads" waking up and "facing the day." Pt states yesterday was "fine" and she signed paperwork for group and rested.  Pt states struggle with "fitting in to life." Pt able to process. Patient  engaged in discussion.       Session Time: 10:00 -11:00  Participation Level: Active  Behavioral Response: CasualAlertDepressed  Type of Therapy: Group Therapy, psychoeducation, psychotherapy  Treatment Goals addressed: Coping  Interventions: CBT, DBT, Solution Focused, Supportive and Reframing  Summary: Cln led discussion on managing anxiety when facing unknowns. Group members shared worries they are dealing with and brainstormed ways they can handle the feelings including planning for what you do know and distraction.  Therapist Response: Pt engaged in discussion and provided support and feedback to other group members. Pt identifies worry of "the future." Pt continues to be vague about her concerns.       Session Time: 11:00- 12:00  Participation Level: Active  Behavioral Response: CasualAlertDepressed  Type of Therapy: Group Therapy, Psychoeducation; Psychotherapy  Treatment Goals addressed: Coping  Interventions: CBT; Solution focused; Supportive; Reframing  Summary: Cln introduced topic of vulnerability. Group viewed TED talk "the problem with vulnerability" to aid in discussion. Group members shared ways in which vulnerability is affecting their lives.    Therapist Response: Pt engaged in discussion and reports vulnerability is a struggle for her. Pt states she is "having a lot of thoughts and feelings" and needs time to process.      Session Time: 12:00 -1:00  Participation Level:Active  Behavioral Response:CasualAlertDepressed  Type of Therapy: Group Therapy, OT  Treatment Goals addressed: Coping  Interventions:Psychosocial skills training, Supportive,   Summary:12:00 - 12:50:Occupational Therapy group 12:50 -1:00 Clinician led check-out. Clinician assessed for immediate needs, medication compliance and efficacy, and safety concerns   Therapist Response:12:00 - 12:50:Patient engaged in group. See OT note.   12:50 - 1:00: At check-out, patientrates hermood at a 4.5 on  a scale of 1-10 with 10 being great. Pt states afternoon plans of cleaning and watching tv. Patient demonstrates someprogress as evidenced byincreased openness.Patient denies SI/HI/self-harm at the end of group.    Suicidal/Homicidal: Nowithout intent/plan  Plan: Pt will continue in PHP while working to decrease depression symptoms, increase hopefulness, and increase ability to manage symptoms in a healthy manner.   Diagnosis: Major depressive disorder, recurrent, severe with psychotic features (HCC) [F33.3]    1. Major depressive disorder, recurrent, severe with psychotic features (HCC)       [name], LCSW 12/18/2018

## 2018-12-19 ENCOUNTER — Other Ambulatory Visit (HOSPITAL_COMMUNITY): Payer: Medicare HMO

## 2018-12-19 ENCOUNTER — Ambulatory Visit (HOSPITAL_COMMUNITY): Payer: Medicare HMO

## 2018-12-19 NOTE — Psych (Signed)
Virtual Visit via Video Note  I connected with Melanie EZELLE on 12/10/18 at  9:00 AM EST by a video enabled telemedicine application and verified that I am speaking with the correct person using two identifiers.   I discussed the limitations of evaluation and management by telemedicine and the availability of in person appointments. The patient expressed understanding and agreed to proceed.  I discussed the assessment and treatment plan with the patient. The patient was provided an opportunity to ask questions and all were answered. The patient agreed with the plan and demonstrated an understanding of the instructions.   The patient was advised to call back or seek an in-person evaluation if the symptoms worsen or if the condition fails to improve as anticipated.  Pt was provided 240 minutes of non-face-to-face time during this encounter.   Lorin Glass, LCSW    [facility] [place] PHP THERAPIST PROGRESS NOTE  TAEYA THEALL [id number]  Session Time: 9:00 - 10:00  Participation Level: Active  Behavioral Response: CasualAlertAnxious and Depressed  Type of Therapy: Group Therapy  Treatment Goals addressed: Coping  Interventions: CBT, DBT, Supportive and Reframing  Summary: Clinician led check-in regarding current stressors and situation, and review of patient completed daily inventory. Clinician utilized active listening and empathetic response and validated patient emotions. Clinician facilitated processing group on pertinent issues.   Therapist Response:Kadynce D Cumby is a 63 y.o. female who presents with depression symptoms.  Patient arrived within time allowed and reports that she is feeling "okay." Patient rates hermood at [facility] a scale of 1-10 with 10 being great. Pt reports she went to the grocery store yesterday and it increased her anxiety. Pt states she was "in disguise" and remained covered so she was not recognizable. Pt shares she did see one old customer however was  able to avoid her. Pt states she was unable to manage the anxiety well and for the rest of her day she was in a bad mood. Pt able to process. Patient engaged in discussion.       Session Time: 10:00-11:00  Participation Level:Active  Behavioral Response:CasualAlertDepressed  Type of Therapy: Group Therapy, psychoeducation, psychotherapy  Treatment Goals addressed: Coping  Interventions:CBT, DBT, Solution Focused, Supportive and Reframing  Summary:Cln led discussion on misunderstandings with those close to [place]. Group discussed ways in which they feel unheard or that their needs are not being met. Cln shared ways active listening, realistic expectations, and boundaries can help address these concerns and group shared ways they could apply them.   Therapist Response: Pt engaged in discussion and identifies that being in her head and being hard on herself might interfere with her relationship dynamics.         Session Time: 11:00- 12:00  Participation Level: Active  Behavioral Response: CasualAlertDepressed  Type of Therapy: Group Therapy, psychotherapy  Treatment Goals addressed: Coping  Interventions: Strengths based, reframing, Supportive,   Summary:  Spiritual Care group  Therapist Response: Patient engaged in group. See chaplain note.          Session Time: 12:00 -1:00  Participation Level: Active  Behavioral Response: CasualAlertDepressed  Type of Therapy: Group Therapy, Psychoeducation  Treatment Goals addressed: Coping  Interventions: relaxation training; Supportive; Reframing  Summary: 12:00 - 12:50: Relaxation group: Cln led group focused on retraining the body's response to stress.   12:50 -1:00 Clinician led check-out. Clinician assessed for immediate needs, medication compliance and efficacy, and safety concerns   Therapist Response:12:00 - 12:50:Patient engaged in activity and discussion  12:50 - 1:00: At check-out, patientrates hermood at a 5 on a scale of 1-10 with 10 being great. Pt states afternoon plans of processing today's discussions and run errands. Patient demonstrates someprogress as evidenced bygoing out despite anxiety.Patient denies SI/HI/self-harm at the end of group.    Suicidal/Homicidal: Nowithout intent/plan  Plan: Pt will continue in PHP while working to decrease depression symptoms, increase hopefulness, and increase ability to manage symptoms in a healthy manner.   Diagnosis: Severe episode of recurrent major depressive disorder, without psychotic features ([place]) [F33.2]    1. Severe episode of recurrent major depressive disorder, without psychotic features ([place])       Lorin Glass, LCSW 12/19/2018

## 2018-12-19 NOTE — Psych (Signed)
Virtual Visit via Video Note  I connected with Melanie Riley on 12/15/18 at  9:00 AM EST by a video enabled telemedicine application and verified that I am speaking with the correct person using two identifiers.   I discussed the limitations of evaluation and management by telemedicine and the availability of in person appointments. The patient expressed understanding and agreed to proceed.  I discussed the assessment and treatment plan with the patient. The patient was provided an opportunity to ask questions and all were answered. The patient agreed with the plan and demonstrated an understanding of the instructions.   The patient was advised to call back or seek an in-person evaluation if the symptoms worsen or if the condition fails to improve as anticipated.  Pt was provided 240 minutes of non-face-to-face time during this encounter.   Lorin Glass, LCSW    [facility] [place] PHP THERAPIST PROGRESS NOTE  Melanie Riley [id number]  Session Time: 9:00 - 10:00  Participation Level: Active  Behavioral Response: CasualAlertAnxious and Depressed  Type of Therapy: Group Therapy  Treatment Goals addressed: Coping  Interventions: CBT, DBT, Supportive and Reframing  Summary: Clinician led check-in regarding current stressors and situation, and review of patient completed daily inventory. Clinician utilized active listening and empathetic response and validated patient emotions. Clinician facilitated processing group on pertinent issues.   Therapist Response:Melanie Riley is a 63 y.o. female who presents with depression symptoms.  Patient arrived within time allowed and reports that she is feeling "decent." Patient rates hermood at [facility] a scale of 1-10 with 10 being great. Pt reports her weekend was good and she stayed at home. Pt reports her daughter and son and his family came to visit and that went well. Pt reports she is feeling "a little stronger." Pt states she struggled with  sleep throughout the weekend. Pt able to process. Patient engaged in discussion.       Session Time: 10:00-11:00  Participation Level:Active  Behavioral Response:CasualAlertDepressed  Type of Therapy: Group Therapy, psychoeducation, psychotherapy  Treatment Goals addressed: Coping  Interventions:CBT, DBT, Solution Focused, Supportive and Reframing  Summary:Cln led discussion on radical acceptance. Group members shared issues they are struggling to accept and processed around them. Cln introduced acceptance as a way to manage expectations, change perspective, and improve mindset. Group members discussed barriers to acceptance and offered each other support and feedback.    Therapist Response: Pt engaged in discussion and expressed she needs to accept her situation and is trying to use her faith to help her. Pt able to process and accept and give support and feedback.        Session Time: 11:00- 12:00  Participation Level:Active  Behavioral Response:CasualAlertDepressed  Type of Therapy: Group Therapy, psychoeducation, psychotherapy  Treatment Goals addressed: Coping  Interventions:CBT, DBT, Solution Focused, Supportive and Reframing  Summary:Cln led discussion on boundaries in relationships. Group members discussed ways boundaries have been an issue and identified current struggles within their relationships. Cln encouraged pt's to accurately identify the person responsible for the boundary issue and to remember that we get to choose what we accept from others.   Therapist Response: Pt reports struggling with boundaries in most of her relationships due to putting every one first throughout her life. Pt is able to process and accept and receive feedback.      Session Time: 12:00 -1:00  Participation Level:Active  Behavioral Response:CasualAlertDepressed  Type of Therapy: Group Therapy, Psychoeducation;  Psychotherapy  Treatment Goals addressed: Coping  Interventions:CBT; Solution focused; Supportive;  Reframing  Summary:12:00 - 12:50Cln continued topic of cognitive distortions.  Cln led review of previous days discussion and group continued working on "catch" to identify distortions as they come up. Cln utilized handout "unhealthy thought patterns" to discuss further examples of distorted thinking.  Group discussed examples from their own life to connect to the material.  12:50 -1:00 Clinician led check-out. Clinician assessed for immediate needs, medication compliance and efficacy, and safety concerns  Therapist Response:12:00 - 12:50Pt reports understanding of topic and identifies false permanence as a struggle for her.  12:50 - 1:00: At check-out, patientrates hermood at a 6 on a scale of 1-10 with 10 being great. Pt states afternoon plans of going grocery shopping. Patient demonstrates someprogress as evidenced byincreased awareness of feelings.Patient denies SI/HI/self-harm at the end of group.    Suicidal/Homicidal: Nowithout intent/plan  Plan: Pt will continue in PHP while working to decrease depression symptoms, increase hopefulness, and increase ability to manage symptoms in a healthy manner.   Diagnosis: Severe episode of recurrent major depressive disorder, without psychotic features (HCC) [F33.2]    1. Severe episode of recurrent major depressive disorder, without psychotic features (HCC)       [name], LCSW 12/19/2018
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
## 2018-12-19 NOTE — Psych (Signed)
Virtual Visit via Video Note  I connected with Melanie Riley on 12/11/18 at  9:00 AM EST by a video enabled telemedicine application and verified that I am speaking with the correct person using two identifiers.   I discussed the limitations of evaluation and management by telemedicine and the availability of in person appointments. The patient expressed understanding and agreed to proceed.  I discussed the assessment and treatment plan with the patient. The patient was provided an opportunity to ask questions and all were answered. The patient agreed with the plan and demonstrated an understanding of the instructions.   The patient was advised to call back or seek an in-person evaluation if the symptoms worsen or if the condition fails to improve as anticipated.  Pt was provided 240 minutes of non-face-to-face time during this encounter.   Lorin Glass, LCSW    [facility] [place] PHP THERAPIST PROGRESS NOTE  Melanie Riley [id number]  Session Time: 9:00 - 10:00  Participation Level: Active  Behavioral Response: CasualAlertAnxious and Depressed  Type of Therapy: Group Therapy  Treatment Goals addressed: Coping  Interventions: CBT, DBT, Supportive and Reframing  Summary: Clinician led check-in regarding current stressors and situation, and review of patient completed daily inventory. Clinician utilized active listening and empathetic response and validated patient emotions. Clinician facilitated processing group on pertinent issues.   Therapist Response:Melanie Riley is a 63 y.o. female who presents with depression symptoms.  Patient arrived within time allowed and reports that she is feeling "okay." Patient rates hermood at [facility] a scale of 1-10 with 10 being great. Pt reports she didn't do anything yesterday and spent the day watching tv, not even having the energy to cook dinner. Pt continues to be guarded regarding the details of what brought her to treatment, even as she  opens up in other ways. Pt able to process. Patient engaged in discussion.       Session Time: 10:00-11:00  Participation Level:Active  Behavioral Response:CasualAlertDepressed  Type of Therapy: Group Therapy, psychoeducation, psychotherapy  Treatment Goals addressed: Coping  Interventions:CBT, DBT, Solution Focused, Supportive and Reframing  Summary:Cln led discussion on negative self-talk and how it affects [place]. Group members shared ways in which they struggle with negative self-talk and made connections to how mental health compacts and interacts with negative self-talk.   Therapist Response: Pt engaged in discussion and reports negative self-talk is an issue for her. Pt shares she struggles because she thinks she deserves the negative talk. Pt able to give and receive support and feedback from group members.        Session Time: 11:00- 12:00  Participation Level:Active  Behavioral Response:CasualAlertDepressed  Type of Therapy: Group Therapy, Psychoeducation; Psychotherapy  Treatment Goals addressed: Coping  Interventions:CBT; Solution focused; Supportive; Reframing  Summary:Cln introduced topic of cognitive distortions and discussed how these distortions work, how they affect [place], and how they shape our reality. Cln introduced Catch-Challenge-Change behavior modification model and how to apply it to cognitive distortions.   Therapist Response: Pt engaged in discussion and reports understanding of topic discussed.       Session Time: 12:00 -1:00  Participation Level:Active  Behavioral Response:CasualAlertDepressed  Type of Therapy: Group Therapy, OT  Treatment Goals addressed: Coping  Interventions:Psychosocial skills training, Supportive,   Summary:12:00 - 12:50:Occupational Therapy group 12:50 -1:00 Clinician led check-out. Clinician assessed for immediate needs, medication compliance and efficacy, and  safety concerns   Therapist Response:12:00 - 12:50:Patient engaged in group. See OT note.  12:50 - 1:00: At check-out, patientrates  hermood at a 6 on a scale of 1-10 with 10 being great. Pt states afternoon plans of doing errands from yesterday. Patient demonstrates someprogress as evidenced byincreased sharing in group.Patient denies SI/HI/self-harm at the end of group.    Suicidal/Homicidal: Nowithout intent/plan  Plan: Pt will continue in PHP while working to decrease depression symptoms, increase hopefulness, and increase ability to manage symptoms in a healthy manner.   Diagnosis: Severe episode of recurrent major depressive disorder, without psychotic features (HCC) [F33.2]    1. Severe episode of recurrent major depressive disorder, without psychotic features (HCC)       [name], LCSW 12/19/2018

## 2018-12-23 NOTE — Psych (Signed)
Virtual Visit via Video Note  I connected with Melanie Riley on 12/17/18 at  9:00 AM EST by a video enabled telemedicine application and verified that I am speaking with the correct person using two identifiers.   I discussed the limitations of evaluation and management by telemedicine and the availability of in person appointments. The patient expressed understanding and agreed to proceed.  I discussed the assessment and treatment plan with the patient. The patient was provided an opportunity to ask questions and all were answered. The patient agreed with the plan and demonstrated an understanding of the instructions.   The patient was advised to call back or seek an in-person evaluation if the symptoms worsen or if the condition fails to improve as anticipated.  Pt was provided 240 minutes of non-face-to-face time during this encounter.   Lorin Glass, LCSW    [facility] PHP THERAPIST PROGRESS NOTE  Melanie Riley [id number]  Session Time: 9:00 - 10:00  Participation Level: Active  Behavioral Response: CasualAlertAnxious and Depressed  Type of Therapy: Group Therapy  Treatment Goals addressed: Coping  Interventions: CBT, DBT, Supportive and Reframing  Summary: Clinician led check-in regarding current stressors and situation, and review of patient completed daily inventory. Clinician utilized active listening and empathetic response and validated patient emotions. Clinician facilitated processing group on pertinent issues.   Therapist Response:Melanie Riley is a 63 y.o. female who presents with depression symptoms.  Patient arrived within time allowed and reports that she is feeling "I can't even say." Patient rates hermood at a0on a scale of 1-10 with 10 being great. Pt reports her daughter went to the emergency room yesterday and was admitted due to masses on her lungs. Pt states she is "distraught" and feeling guilty and hopeless due to not being able to be with  her daughter due to COVID protocols. Pt reports she is feeling "overwhelmed" and did not sleep. Pt able to process. Patient engaged in discussion.       Session Time: 10:00-11:00  Participation Level:Active  Behavioral Response:CasualAlertDepressed  Type of Therapy: Group Therapy, psychoeducation, psychotherapy  Treatment Goals addressed: Coping  Interventions:CBT, DBT, Solution Focused, Supportive and Reframing  Summary:Cln led discussion on how to deal with difficult people in our lives. Group members shared characteristics they struggle with such as agressiveness,  manipulation, and complicated histories. Group members discussed the negative impact of interacting with these people and ways to manage it.    Therapist Response: Pt engages in discussion and provides support and feedback to other group members however is distracted throughout session.         Session Time: 11:00- 12:00  Participation Level: Active  Behavioral Response: CasualAlertDepressed  Type of Therapy: Group Therapy, psychotherapy  Treatment Goals addressed: Coping  Interventions: Strengths based, reframing, Supportive,   Summary:  Spiritual Care group  Therapist Response: Patient engaged in group. See chaplain note.          Session Time: 12:00 -1:00  Participation Level:Active  Behavioral Response:CasualAlertDepressed  Type of Therapy: Group Therapy, psychoeducation, psychotherapy  Treatment Goals addressed: Coping  Interventions:CBT, DBT, Solution Focused, Supportive and Reframing  Summary:12:00 - 12:50 Cln led discussion on the importance of support. Group members shared what their support system looks like and benefits and defeciencies in them. Cln encouraged support groups as an additional means of support. 12:50 -1:00 Clinician led check-out. Clinician assessed for immediate needs, medication compliance and efficacy, and safety  concerns  Therapist Response: Pt participated and engaged in discussion. Pt  identifies what support she has and states she is open to support groups.  12:50 - 1:00: At check-out, patientrates hermood at a 5 on a scale of 1-10 with 10 being great. Pt states afternoon plans of visiting her daughter. Patient demonstrates someprogress as evidenced bymaintaining positivity despite extreme hardship.Patient denies SI/HI/self-harm at the end of group.    Suicidal/Homicidal: Nowithout intent/plan  Plan: Pt will discharge from PHP due to meeting treatment goals of decreased depression symptoms, increased hopefulness, and increased ability to manage symptoms in a healthy manner. Pt has declined IOP due to wanting individual treatment. Pt has follow-up appointment with Princess Perna within this agency 12/17. Pt and provider are aligned with discharge. Pt denies SI/HI at time of discharge.   Diagnosis: Severe episode of recurrent major depressive disorder, without psychotic features (HCC) [F33.2]    1. Severe episode of recurrent major depressive disorder, without psychotic features (HCC)       [name], LCSW 12/23/2018

## 2018-12-23 NOTE — Psych (Signed)
Virtual Visit via Video Note  I connected with Melanie Riley on 12/16/18 at  9:00 AM EST by a video enabled telemedicine application and verified that I am speaking with the correct person using two identifiers.   I discussed the limitations of evaluation and management by telemedicine and the availability of in person appointments. The patient expressed understanding and agreed to proceed.  I discussed the assessment and treatment plan with the patient. The patient was provided an opportunity to ask questions and all were answered. The patient agreed with the plan and demonstrated an understanding of the instructions.   The patient was advised to call back or seek an in-person evaluation if the symptoms worsen or if the condition fails to improve as anticipated.  Pt was provided 240 minutes of non-face-to-face time during this encounter.   Lorin Glass, LCSW    [facility] [place] PHP THERAPIST PROGRESS NOTE  Melanie Riley [id number]  Session Time: 9:00 - 10:00  Participation Level: Active  Behavioral Response: CasualAlertAnxious and Depressed  Type of Therapy: Group Therapy  Treatment Goals addressed: Coping  Interventions: CBT, DBT, Supportive and Reframing  Summary: Clinician led check-in regarding current stressors and situation, and review of patient completed daily inventory. Clinician utilized active listening and empathetic response and validated patient emotions. Clinician facilitated processing group on pertinent issues.   Therapist Response:Melanie Riley is a 63 y.o. female who presents with depression symptoms.  Patient arrived within time allowed and reports that she is feeling "okay." Patient rates hermood at [facility] a scale of 1-10 with 10 being great. Pt reports yesterday went "fine" however she had some anxious moments and thoughts centered around people being mad at her. Pt states she continues to struggle with being out in public due to these anxieties. Pt  able to process. Patient engaged in discussion.       Session Time: 10:00-11:00  Participation Level:Active  Behavioral Response:CasualAlertDepressed  Type of Therapy: Group Therapy, psychoeducation, psychotherapy  Treatment Goals addressed: Coping  Interventions:CBT, DBT, Solution Focused, Supportive and Reframing  Summary:Cln led discussion on instilling positive habits. Group discussed nerves around being able to continue positive habits after discharge. Cln encouraged pt's to be working on new habits now so continuation is the focus post-discharge. Group members discussed ways they have already begun applying skills to recognize the progresses already made.   Therapist Response: Pt engaged in dicussion and provided feedback to other group members. Pt states concern over "relapsing" and not keeping positive habits once treatment is over. Pt is able to identify ways she has been applying the skills in her life.        Session Time: 11:00- 12:00  Participation Level:Active  Behavioral Response:CasualAlertDepressed  Type of Therapy: Group Therapy, Psychoeducation; Psychotherapy  Treatment Goals addressed: Coping  Interventions:CBT; Solution focused; Supportive; Reframing  Summary:Cln continued topic of cognitive distortions. Cln utilized Web designer Questions" as tool to "challenge" distorted thinking. Group used examples to work on how to challenge appropriately.    Therapist Response: Pt engaged in discussion and reports understanding of "challenges" to distorted thoughts. Pt applied topic to her life and discussed ways this can help her in her relationships.        Session Time: 12:00 -1:00  Participation Level:Active  Behavioral Response:CasualAlertDepressed  Type of Therapy: Group Therapy, OT  Treatment Goals addressed: Coping  Interventions:Psychosocial skills training, Supportive,   Summary:12:00  - 12:50:Occupational Therapy group 12:50 -1:00 Clinician led check-out. Clinician assessed for immediate needs, medication compliance and efficacy,  and safety concerns   Therapist Response:12:00 - 12:50:Patient engaged in group. See OT note.  12:50 - 1:00: At check-out, patientrates hermood at a 6 on a scale of 1-10 with 10 being great. Pt states afternoon plans of napping and cooking. Patient demonstrates someprogress as evidenced byincreased sharing in group.Patient denies SI/HI/self-harm at the end of group.    Suicidal/Homicidal: Nowithout intent/plan  Plan: Pt will continue in PHP while working to decrease depression symptoms, increase hopefulness, and increase ability to manage symptoms in a healthy manner.   Diagnosis: Severe episode of recurrent major depressive disorder, without psychotic features (HCC) [F33.2]    1. Severe episode of recurrent major depressive disorder, without psychotic features (HCC)   2. Difficulty coping       [name], LCSW 12/23/2018

## 2019-04-09 DIAGNOSIS — M67911 Unspecified disorder of synovium and tendon, right shoulder: Secondary | ICD-10-CM | POA: Diagnosis not present

## 2019-04-09 DIAGNOSIS — M25511 Pain in right shoulder: Secondary | ICD-10-CM | POA: Diagnosis not present

## 2019-05-05 DIAGNOSIS — M25561 Pain in right knee: Secondary | ICD-10-CM | POA: Diagnosis not present

## 2019-05-18 DIAGNOSIS — M25511 Pain in right shoulder: Secondary | ICD-10-CM | POA: Diagnosis not present

## 2019-05-28 DIAGNOSIS — M24811 Other specific joint derangements of right shoulder, not elsewhere classified: Secondary | ICD-10-CM | POA: Diagnosis not present

## 2019-05-28 DIAGNOSIS — M67911 Unspecified disorder of synovium and tendon, right shoulder: Secondary | ICD-10-CM | POA: Diagnosis not present

## 2019-12-02 DEATH — deceased
